# Patient Record
Sex: Female | Born: 1945 | Race: Black or African American | Hispanic: No | Smoking: Never smoker
Health system: Southern US, Community
[De-identification: ages and names within clinical notes are randomized; demographics above are authoritative.]

## PROBLEM LIST (undated history)

## (undated) DIAGNOSIS — I10 Essential (primary) hypertension: Secondary | ICD-10-CM

## (undated) DIAGNOSIS — Z9889 Other specified postprocedural states: Secondary | ICD-10-CM

## (undated) DIAGNOSIS — E669 Obesity, unspecified: Secondary | ICD-10-CM

## (undated) DIAGNOSIS — M719 Bursopathy, unspecified: Secondary | ICD-10-CM

## (undated) DIAGNOSIS — R51 Headache: Secondary | ICD-10-CM

## (undated) DIAGNOSIS — D219 Benign neoplasm of connective and other soft tissue, unspecified: Secondary | ICD-10-CM

## (undated) DIAGNOSIS — Z8742 Personal history of other diseases of the female genital tract: Secondary | ICD-10-CM

## (undated) DIAGNOSIS — Z789 Other specified health status: Secondary | ICD-10-CM

## (undated) DIAGNOSIS — R519 Headache, unspecified: Secondary | ICD-10-CM

## (undated) DIAGNOSIS — R1900 Intra-abdominal and pelvic swelling, mass and lump, unspecified site: Secondary | ICD-10-CM

## (undated) DIAGNOSIS — J309 Allergic rhinitis, unspecified: Secondary | ICD-10-CM

## (undated) DIAGNOSIS — R9431 Abnormal electrocardiogram [ECG] [EKG]: Secondary | ICD-10-CM

## (undated) HISTORY — DX: Intra-abdominal and pelvic swelling, mass and lump, unspecified site: R19.00

## (undated) HISTORY — DX: Benign neoplasm of connective and other soft tissue, unspecified: D21.9

## (undated) HISTORY — DX: Allergic rhinitis, unspecified: J30.9

## (undated) HISTORY — DX: Obesity, unspecified: E66.9

## (undated) HISTORY — PX: COLONOSCOPY: SHX174

## (undated) HISTORY — DX: Headache: R51

## (undated) HISTORY — DX: Headache, unspecified: R51.9

## (undated) HISTORY — DX: Personal history of other diseases of the female genital tract: Z98.890

## (undated) HISTORY — DX: Abnormal electrocardiogram (ECG) (EKG): R94.31

## (undated) HISTORY — DX: Essential (primary) hypertension: I10

## (undated) HISTORY — DX: Personal history of other diseases of the female genital tract: Z87.42

---

## 1988-05-22 HISTORY — PX: ABDOMINAL HYSTERECTOMY: SHX81

## 1997-11-25 ENCOUNTER — Other Ambulatory Visit: Admission: RE | Admit: 1997-11-25 | Discharge: 1997-11-25 | Payer: Self-pay | Admitting: Internal Medicine

## 1998-01-19 ENCOUNTER — Ambulatory Visit (HOSPITAL_COMMUNITY): Admission: RE | Admit: 1998-01-19 | Discharge: 1998-01-19 | Payer: Self-pay | Admitting: Internal Medicine

## 1998-01-21 ENCOUNTER — Ambulatory Visit (HOSPITAL_COMMUNITY): Admission: RE | Admit: 1998-01-21 | Discharge: 1998-01-21 | Payer: Self-pay | Admitting: Internal Medicine

## 1998-03-09 ENCOUNTER — Ambulatory Visit (HOSPITAL_COMMUNITY): Admission: RE | Admit: 1998-03-09 | Discharge: 1998-03-09 | Payer: Self-pay | Admitting: *Deleted

## 1998-05-21 ENCOUNTER — Ambulatory Visit (HOSPITAL_COMMUNITY): Admission: RE | Admit: 1998-05-21 | Discharge: 1998-05-21 | Payer: Self-pay | Admitting: Internal Medicine

## 1998-05-21 ENCOUNTER — Encounter: Payer: Self-pay | Admitting: Internal Medicine

## 1998-12-16 ENCOUNTER — Encounter: Payer: Self-pay | Admitting: Internal Medicine

## 1998-12-16 ENCOUNTER — Emergency Department (HOSPITAL_COMMUNITY): Admission: EM | Admit: 1998-12-16 | Discharge: 1998-12-16 | Payer: Self-pay | Admitting: Internal Medicine

## 2000-06-19 ENCOUNTER — Encounter: Admission: RE | Admit: 2000-06-19 | Discharge: 2000-06-19 | Payer: Self-pay | Admitting: Internal Medicine

## 2000-06-19 ENCOUNTER — Encounter: Payer: Self-pay | Admitting: Internal Medicine

## 2000-09-26 ENCOUNTER — Encounter: Payer: Self-pay | Admitting: Internal Medicine

## 2000-09-26 ENCOUNTER — Encounter: Admission: RE | Admit: 2000-09-26 | Discharge: 2000-09-26 | Payer: Self-pay | Admitting: Internal Medicine

## 2001-03-04 ENCOUNTER — Other Ambulatory Visit: Admission: RE | Admit: 2001-03-04 | Discharge: 2001-03-04 | Payer: Self-pay | Admitting: Internal Medicine

## 2001-04-23 ENCOUNTER — Emergency Department (HOSPITAL_COMMUNITY): Admission: EM | Admit: 2001-04-23 | Discharge: 2001-04-23 | Payer: Self-pay

## 2004-03-25 ENCOUNTER — Other Ambulatory Visit: Admission: RE | Admit: 2004-03-25 | Discharge: 2004-03-25 | Payer: Self-pay | Admitting: Internal Medicine

## 2004-04-13 ENCOUNTER — Encounter: Admission: RE | Admit: 2004-04-13 | Discharge: 2004-04-13 | Payer: Self-pay | Admitting: Internal Medicine

## 2005-05-22 HISTORY — PX: INNER EAR SURGERY: SHX679

## 2005-06-09 ENCOUNTER — Encounter: Admission: RE | Admit: 2005-06-09 | Discharge: 2005-06-09 | Payer: Self-pay | Admitting: Internal Medicine

## 2005-06-28 ENCOUNTER — Encounter: Admission: RE | Admit: 2005-06-28 | Discharge: 2005-06-28 | Payer: Self-pay | Admitting: Internal Medicine

## 2005-07-13 ENCOUNTER — Other Ambulatory Visit: Admission: RE | Admit: 2005-07-13 | Discharge: 2005-07-13 | Payer: Self-pay | Admitting: Internal Medicine

## 2006-01-04 ENCOUNTER — Encounter: Admission: RE | Admit: 2006-01-04 | Discharge: 2006-01-04 | Payer: Self-pay | Admitting: Internal Medicine

## 2006-02-26 ENCOUNTER — Encounter: Admission: RE | Admit: 2006-02-26 | Discharge: 2006-02-26 | Payer: Self-pay | Admitting: Internal Medicine

## 2006-07-02 ENCOUNTER — Encounter: Admission: RE | Admit: 2006-07-02 | Discharge: 2006-07-02 | Payer: Self-pay | Admitting: Internal Medicine

## 2007-07-17 ENCOUNTER — Encounter: Admission: RE | Admit: 2007-07-17 | Discharge: 2007-07-17 | Payer: Self-pay | Admitting: Internal Medicine

## 2008-06-16 ENCOUNTER — Encounter: Admission: RE | Admit: 2008-06-16 | Discharge: 2008-06-16 | Payer: Self-pay | Admitting: Internal Medicine

## 2008-07-23 ENCOUNTER — Encounter: Admission: RE | Admit: 2008-07-23 | Discharge: 2008-07-23 | Payer: Self-pay | Admitting: Obstetrics and Gynecology

## 2009-06-03 ENCOUNTER — Encounter: Admission: RE | Admit: 2009-06-03 | Discharge: 2009-06-03 | Payer: Self-pay | Admitting: Internal Medicine

## 2009-07-30 ENCOUNTER — Encounter: Admission: RE | Admit: 2009-07-30 | Discharge: 2009-07-30 | Payer: Self-pay | Admitting: Obstetrics and Gynecology

## 2010-07-08 ENCOUNTER — Other Ambulatory Visit: Payer: Self-pay | Admitting: Obstetrics and Gynecology

## 2010-07-08 DIAGNOSIS — Z1231 Encounter for screening mammogram for malignant neoplasm of breast: Secondary | ICD-10-CM

## 2010-08-02 ENCOUNTER — Ambulatory Visit
Admission: RE | Admit: 2010-08-02 | Discharge: 2010-08-02 | Disposition: A | Discharge status: Home / Self Care | Payer: BC Managed Care – PPO | Source: Ambulatory Visit | Attending: Obstetrics and Gynecology | Admitting: Obstetrics and Gynecology

## 2010-08-02 ENCOUNTER — Ambulatory Visit: Payer: Self-pay

## 2010-08-02 DIAGNOSIS — Z1231 Encounter for screening mammogram for malignant neoplasm of breast: Secondary | ICD-10-CM

## 2010-11-04 ENCOUNTER — Encounter (INDEPENDENT_AMBULATORY_CARE_PROVIDER_SITE_OTHER): Payer: BC Managed Care – PPO

## 2010-11-04 DIAGNOSIS — M79609 Pain in unspecified limb: Secondary | ICD-10-CM

## 2010-11-21 NOTE — Procedures (Unsigned)
DUPLEX DEEP VENOUS EXAM - LOWER EXTREMITY  INDICATION:  Left lower extremity pain  HISTORY:  Edema:  No Trauma/Surgery:  No Pain:  Yes PE:  No Previous DVT:  No Anticoagulants:  No Other:  DUPLEX EXAM:               CFV   SFV   PopV  PTV    GSV               R  L  R  L  R  L  R   L  R  L Thrombosis    o  o     o     o      o     o Spontaneous   +  +     +     +      +     + Phasic        +  +     +     +      +     + Augmentation  +  +     +     +      +     + Compressible  +  +     +     +      +     + Competent     +  +     +     +      +     +  Legend:  + - yes  o - no  p - partial  D - decreased  IMPRESSION: 1. No evidence of left lower extremity deep venous thrombosis. 2. Preliminary results were called to Dr. August Saucer.   _____________________________ Larina Earthly, M.D.  EM/MEDQ  D:  11/07/2010  T:  11/07/2010  Job:  [id number]

## 2011-07-17 DIAGNOSIS — H61303 Acquired stenosis of external ear canal, unspecified, bilateral: Secondary | ICD-10-CM | POA: Insufficient documentation

## 2011-08-04 ENCOUNTER — Other Ambulatory Visit: Payer: Self-pay | Admitting: Obstetrics and Gynecology

## 2011-08-04 DIAGNOSIS — Z1231 Encounter for screening mammogram for malignant neoplasm of breast: Secondary | ICD-10-CM

## 2011-08-11 ENCOUNTER — Ambulatory Visit: Payer: BC Managed Care – PPO

## 2011-08-14 ENCOUNTER — Ambulatory Visit
Admission: RE | Admit: 2011-08-14 | Discharge: 2011-08-14 | Disposition: A | Discharge status: Home / Self Care | Payer: BC Managed Care – PPO | Source: Ambulatory Visit | Attending: Obstetrics and Gynecology | Admitting: Obstetrics and Gynecology

## 2011-08-14 DIAGNOSIS — Z1231 Encounter for screening mammogram for malignant neoplasm of breast: Secondary | ICD-10-CM

## 2012-06-14 ENCOUNTER — Encounter (HOSPITAL_COMMUNITY): Payer: Self-pay

## 2012-06-14 ENCOUNTER — Ambulatory Visit: Payer: BC Managed Care – PPO | Attending: Gynecology | Admitting: Gynecology

## 2012-06-14 ENCOUNTER — Ambulatory Visit (HOSPITAL_COMMUNITY)
Admission: RE | Admit: 2012-06-14 | Payer: BC Managed Care – PPO | Source: Ambulatory Visit | Admitting: Obstetrics and Gynecology

## 2012-06-14 ENCOUNTER — Encounter (HOSPITAL_COMMUNITY): Admission: RE | Payer: Self-pay | Source: Ambulatory Visit

## 2012-06-14 ENCOUNTER — Encounter: Payer: Self-pay | Admitting: Gynecology

## 2012-06-14 ENCOUNTER — Ambulatory Visit (HOSPITAL_COMMUNITY)
Admission: RE | Admit: 2012-06-14 | Discharge: 2012-06-14 | Disposition: A | Discharge status: Home / Self Care | Payer: BC Managed Care – PPO | Source: Ambulatory Visit | Attending: Gynecology | Admitting: Gynecology

## 2012-06-14 VITALS — BP 148/76 | HR 80 | Temp 98.2°F | Resp 20 | Ht 62.0 in | Wt 177.0 lb

## 2012-06-14 DIAGNOSIS — K7689 Other specified diseases of liver: Secondary | ICD-10-CM | POA: Insufficient documentation

## 2012-06-14 DIAGNOSIS — Z79899 Other long term (current) drug therapy: Secondary | ICD-10-CM | POA: Insufficient documentation

## 2012-06-14 DIAGNOSIS — R1900 Intra-abdominal and pelvic swelling, mass and lump, unspecified site: Secondary | ICD-10-CM

## 2012-06-14 DIAGNOSIS — Z90710 Acquired absence of both cervix and uterus: Secondary | ICD-10-CM | POA: Insufficient documentation

## 2012-06-14 DIAGNOSIS — R978 Other abnormal tumor markers: Secondary | ICD-10-CM | POA: Insufficient documentation

## 2012-06-14 DIAGNOSIS — R918 Other nonspecific abnormal finding of lung field: Secondary | ICD-10-CM | POA: Insufficient documentation

## 2012-06-14 DIAGNOSIS — N9489 Other specified conditions associated with female genital organs and menstrual cycle: Secondary | ICD-10-CM | POA: Insufficient documentation

## 2012-06-14 DIAGNOSIS — R772 Abnormality of alphafetoprotein: Secondary | ICD-10-CM | POA: Insufficient documentation

## 2012-06-14 DIAGNOSIS — N83209 Unspecified ovarian cyst, unspecified side: Secondary | ICD-10-CM

## 2012-06-14 SURGERY — LAPAROSCOPY OPERATIVE
Anesthesia: General

## 2012-06-14 MED ORDER — IOHEXOL 300 MG/ML  SOLN: 100.0000 mL | Freq: Once | INTRAMUSCULAR | Status: AC | PRN

## 2012-06-14 NOTE — Progress Notes (Signed)
Consult Note: Gyn-Onc   Elizabeth Chan 67 y.o. female  Chief Complaint  Patient presents with  . Pelvic Mass    New patient     HPI: 67 year old African American female seen in consultation at the request of Dr. Rudean Haskell regarding management of an ovarian mass and an elevated alpha-fetoprotein.  The patient was found to have an ovarian mass on routine gynecologic examination. Subsequent imaging with a transvaginal ultrasound revealed a 6.4 x 4.1 x 7.2 cm mass which was cystic with a thick wall and some septations. The mass was avascular. No ascites was noted. Tumor markers including CEA CA 125 and alpha-fetoprotein were obtained. The alpha-fetoprotein value returned slightly elevated at 11.4 (normal range 0-8.0 ng/mL.  Patient has a past history of undergoing a hysterectomy for symptomatic uterine fibroids. She has no other gynecologic history.  Review of Systems:10 point review of systems is negative as noted above.   Vitals: Blood pressure 148/76, pulse 80, temperature 98.2 F (36.8 C), resp. rate 20, height 5\' 2"  (1.575 m), weight 177 lb (80.287 kg).  Physical Exam: General : The patient is a healthy woman in no acute distress.  HEENT: normocephalic, extraoccular movements normal; neck is supple without thyromegally  Lynphnodes: Supraclavicular and inguinal nodes not enlarged  Abdomen: Soft, non-tender, no ascites, no organomegally, no masses, no hernias  Pelvic:  EGBUS: Normal female  Vagina: Normal, no lesions  Urethra and Bladder: Normal, non-tender  Cervix: Surgically absent  Uterus: Surgically absent  Bi-manual examination: There is a smooth 6 cm mass to the right of the vaginal cuff. It is not tender and is slightly mobile. Rectal: normal sphincter tone, no masses, no blood  Lower extremities: No edema or varicosities. Normal range of motion    Assessment/Plan: Ovarian cyst with a thick wall but appearing benign. Further the usual tumor markers for epithelial  ovarian cancers are normal.  The elevated alpha-fetoprotein is of some concern in that we do not have a good explanation for the elevation. I believe that is unlikely that her ovarian cyst is a germ cell tumor. Therefore I would recommend we search elsewhere for other sources of an elevated alpha-fetoprotein. (The elevated alpha-fetoprotein likely secondary to hepatitis, colitis, hepatocellular carcinoma, other germ cell tumors, lymphoma, renal cell cancer, or pancreatic cancer. )  We will schedule the patient had a CT scan of the chest abdomen and pelvis prior to the salpingo-oophorectomy.  The patient's questions are answered.  No Known Allergies  Past Medical History  Diagnosis Date  . Fibroids   . Pelvic mass in female     Past Surgical History  Procedure Date  . Abdominal hysterectomy 1990    for fibroids  . Inner ear surgery 2007    Current Outpatient Prescriptions  Medication Sig Dispense Refill  . Calcium Carbonate-Vitamin D (CALCIUM 600 + D PO) Take 2 tablets by mouth daily.      Marland Kitchen levocetirizine (XYZAL) 5 MG tablet Take 5 mg by mouth every evening.      . montelukast (SINGULAIR) 10 MG tablet Take 10 mg by mouth at bedtime.      . multivitamin-iron-minerals-folic acid (CENTRUM) chewable tablet Chew 1 tablet by mouth daily.      . Nutritional Supplements (MENOPAUSE FORMULA PO) Take 1 tablet by mouth at bedtime.      . triamterene-hydrochlorothiazide (MAXZIDE-25) 37.5-25 MG per tablet Take 1 tablet by mouth daily.        History   Social History  . Marital Status: Widowed  Spouse Name: N/A    Number of Children: N/A  . Years of Education: N/A   Occupational History  . Not on file.   Social History Main Topics  . Smoking status: Never Smoker   . Smokeless tobacco: Not on file  . Alcohol Use: No  . Drug Use: No  . Sexually Active:    Other Topics Concern  . Not on file   Social History Narrative  . No narrative on file    Family History  Problem  Relation Age of Onset  . Hypertension Mother   . Diabetes Sister   . Breast cancer Maternal Aunt       Jeannette Corpus, MD 06/14/2012, 10:11 AM

## 2012-06-14 NOTE — Patient Instructions (Signed)
We will obtain a CT scan of the chest abdomen and pelvis and contact you with the results.

## 2012-06-18 ENCOUNTER — Encounter (HOSPITAL_COMMUNITY): Payer: Self-pay | Admitting: Pharmacist

## 2012-06-20 ENCOUNTER — Encounter (HOSPITAL_COMMUNITY): Payer: Self-pay

## 2012-06-20 ENCOUNTER — Encounter (HOSPITAL_COMMUNITY)
Admission: RE | Admit: 2012-06-20 | Discharge: 2012-06-20 | Disposition: A | Discharge status: Home / Self Care | Payer: BC Managed Care – PPO | Source: Ambulatory Visit | Attending: Obstetrics and Gynecology | Admitting: Obstetrics and Gynecology

## 2012-06-20 DIAGNOSIS — Z01818 Encounter for other preprocedural examination: Secondary | ICD-10-CM | POA: Insufficient documentation

## 2012-06-20 DIAGNOSIS — Z01812 Encounter for preprocedural laboratory examination: Secondary | ICD-10-CM | POA: Insufficient documentation

## 2012-06-20 HISTORY — DX: Other specified health status: Z78.9

## 2012-06-20 LAB — BASIC METABOLIC PANEL
BUN: 13 mg/dL (ref 6–23)
CO2: 26 mEq/L (ref 19–32)
Calcium: 10.1 mg/dL (ref 8.4–10.5)
Chloride: 104 mEq/L (ref 96–112)
Creatinine, Ser: 0.77 mg/dL (ref 0.50–1.10)
GFR calc Af Amer: >90 mL/min (ref >90)
GFR calc non Af Amer: 87 mL/min — ABNORMAL LOW (ref >90)
Glucose, Bld: 83 mg/dL (ref 70–99)
Potassium: 3.7 mEq/L (ref 3.5–5.1)
Sodium: 141 mEq/L (ref 135–145)

## 2012-06-20 LAB — CBC
HCT: 38.8 % (ref 36.0–46.0)
Hemoglobin: 13.2 g/dL (ref 12.0–15.0)
MCH: 30.7 pg (ref 26.0–34.0)
MCHC: 34 g/dL (ref 30.0–36.0)
MCV: 90.2 fL (ref 78.0–100.0)
Platelets: 268 10*3/uL (ref 150–400)
RBC: 4.3 MIL/uL (ref 3.87–5.11)
RDW: 13.9 % (ref 11.5–15.5)
WBC: 4.1 10*3/uL (ref 4.0–10.5)

## 2012-06-20 LAB — SURGICAL PCR SCREEN
MRSA, PCR: NEGATIVE
Staphylococcus aureus: NEGATIVE

## 2012-06-20 NOTE — Pre-Procedure Instructions (Signed)
EKG reviewed by Dr Malen Gauze. He requested cardiac clearance prior to surg. Appt made by pt during PAT appt to see Dr Willey Blade this afternoon. Call made to Dr. Paralee Cancel office Selena Batten) to make her aware. Previous EKG requested from Dr Diamantina Providence office if available.

## 2012-06-20 NOTE — Patient Instructions (Addendum)
20 Elizabeth Chan  06/20/2012   Your procedure is scheduled on:  06/24/12  Enter through the Main Entrance of [facility] at 6 AM.  Pick up the phone at the desk and dial [phone].   Call this number if you have problems the morning of surgery: [phone]   Remember:   Do not eat food:After Midnight.  Do not drink clear liquids: After Midnight.  Take these medicines the morning of surgery with A SIP OF WATER: NA   Do not wear jewelry, make-up or nail polish.  Do not wear lotions, powders, or perfumes. You may wear deodorant.  Do not shave 48 hours prior to surgery.  Do not bring valuables to the hospital.  Contacts, dentures or bridgework may not be worn into surgery.  Leave suitcase in the car. After surgery it may be brought to your room.  For patients admitted to the hospital, checkout time is 11:00 AM the day of discharge.   Patients discharged the day of surgery will not be allowed to drive home.  Name and phone number of your driver: NA  Special Instructions: Shower using CHG 2 nights before surgery and the night before surgery.  If you shower the day of surgery use CHG.  Use special wash - you have one bottle of CHG for all showers.  You should use approximately 1/3 of the bottle for each shower.   Please read over the following fact sheets that you were given: MRSA Information

## 2012-06-24 ENCOUNTER — Ambulatory Visit (HOSPITAL_COMMUNITY)
Admission: RE | Admit: 2012-06-24 | Payer: BC Managed Care – PPO | Source: Ambulatory Visit | Admitting: Obstetrics and Gynecology

## 2012-06-24 ENCOUNTER — Encounter (HOSPITAL_COMMUNITY): Admission: RE | Payer: Self-pay | Source: Ambulatory Visit

## 2012-06-24 SURGERY — LAPAROSCOPY OPERATIVE
Anesthesia: General

## 2012-06-28 ENCOUNTER — Encounter (HOSPITAL_COMMUNITY): Payer: Self-pay

## 2012-06-28 NOTE — Progress Notes (Signed)
Spoke to [place] at Dr Alfonso Patten office concerning clearance for surgery on 2/17.  Pt had pat on 1/30 but needed evaluation and clearance from Dr August Saucer  Office states clearance given -will fax EKG and clearance to [place].

## 2012-07-07 ENCOUNTER — Other Ambulatory Visit: Payer: Self-pay | Admitting: Obstetrics and Gynecology

## 2012-07-07 NOTE — H&P (Signed)
Elizabeth Chan is an 67 y.o. female. [id number] s/p Hysterectomy for fibroids.  In December 2013 came in for a routine exam, complained of backache and pelvic pressure on exam. An ultrasound revealed a 64 x 41 x 72 mm right ovarian cyst, possibly a cystadenoma. An ovarian tumor marker profile was within normal limits except the AFP was slightly elevated.  A gyn Oncology consult was obtained by Dr De Blanch. A CT scan was ordered the had the appearance of a benign cyst and other abdominal and pelvic findings were normal.  The patient was given the option of having her surgery in [facility] with Dr Stanford Breed and opted to stay here.  Risks, including the possibility of an occult malignancy have been discussed.  Pt is aware of possiblity of laparotomy and addition procedures if indicated.  Informed consent was given for LS BSO and possible laparotomy.   Pertinent Gynecological History: Menses: post-menopausal and and s/p hysterectomy Bleeding: none Contraception: post menopausal status DES exposure: denies Blood transfusions: none Sexually transmitted diseases: no past history Previous GYN Procedures: hysterectomy fibroids 1990  Last mammogram: normal Date: 04/2012 Last pap: normal Date: 2010    Menstrual History: No LMP recorded. Patient has had a hysterectomy.    Past Medical History  Diagnosis Date  . Fibroids   . Pelvic mass in female   . No pertinent past medical history     Past Surgical History  Procedure Laterality Date  . Abdominal hysterectomy  1990    for fibroids  . Inner ear surgery  2007    Family History  Problem Relation Age of Onset  . Hypertension Mother   . Diabetes Sister   . Breast cancer Maternal Aunt     Social History:  reports that she has never smoked. She has never used smokeless tobacco. She reports that she does not drink alcohol or use illicit drugs.  Allergies:  Allergies  Allergen Reactions  . Doxycycline Itching   ROS non  contributory  PHYSICALEXAM  General Well developed, no distress HEENT nl Chest clear S1 S2 clear Abd BS present, no palpable masses Exr nl Pelvic uterus absent Posterior cul de sac, 4-5 cm palpable ? Cyst Skin:  Hx of lichen plalnus   No results found for this or any previous visit (from the past 24 hour(s)).  No results found.  Assessment/Plan:66 yo with a large ovarion cyst noted.  Right ovarian cyst Slightly elevated AFP P:  LS BSO with removal of right ovarian cyst  Elizabeth Chan E 07/07/2012, 10:16 PM

## 2012-07-07 NOTE — H&P (Signed)
Elizabeth Chan is an 66 y.o. female G3 P2 A1 s/p hysterectomy in 1990 for fibroids who came in for a routine gyn exam in December and complained of backache and pelvic pressure on exam.  Ultrasound revealed a 64 x 41 x 72 mm mass, benign appearing.  The ovarian tumor marker profile was normal except the AFP was slightly elevated. Dr Clarke Pearson, Gyn Oncologist was consulted.  CT scan was consistent with a benign cyst and abdominal and pelvic organs otherwise appeared normal.  Elizabeth Chan was given the option of having surgery in [place] with Dr Clarke Pearson and opted to have it here.  Risks, possible complications have been discussed including the possibility of occult malignancy requiring additional procedures at a later date have been discussed and informed consent was given for LS BSO and possible laparotomy.    Pertinent Gynecological History: Menses: post-menopausal and hyst Bleeding: no Contraception: status post hysterectomy DES exposure: denies Blood transfusions: none Sexually transmitted diseases: no past history Previous GYN Procedures: hyst 1990 fibroids  Last mammogram: normal Date: 2013 Last pap: normal Date: 2010    Menstrual History: Menarche age: na No LMP recorded. Patient has had a hysterectomy.    Past Medical History  Diagnosis Date  . Fibroids   . Pelvic mass in female   . No pertinent past medical history     Past Surgical History  Procedure Laterality Date  . Abdominal hysterectomy  1990    for fibroids  . Inner ear surgery  2007    Family History  Problem Relation Age of Onset  . Hypertension Mother   . Diabetes Sister   . Breast cancer Maternal Aunt     Social History:  reports that she has never smoked. She has never used smokeless tobacco. She reports that she does not drink alcohol or use illicit drugs.  Allergies:  Allergies  Allergen Reactions  . Doxycycline Itching     (Not in a hospital admission)  ROS Non  contributory  Physical exam  General  No distress, well developed HEENT nl Chest clear Heart S1 and S2 clear Abd BS present, no masses Ext nl Pelvic fullness near cul de sac, slightly tender Skin  Lichen planus      No results found.  Assessment/Plan: Right ovarian cyst, slightly elevated AFP P: LS BSO, possible laparotomy   GREENE,ELEANOR E 07/07/2012, 10:51 PM  

## 2012-07-08 ENCOUNTER — Encounter (HOSPITAL_COMMUNITY): Payer: Self-pay | Admitting: Anesthesiology

## 2012-07-08 ENCOUNTER — Encounter (HOSPITAL_COMMUNITY)
Admission: RE | Disposition: A | Discharge status: Home / Self Care | Payer: Self-pay | Source: Ambulatory Visit | Attending: Obstetrics and Gynecology

## 2012-07-08 ENCOUNTER — Ambulatory Visit (HOSPITAL_COMMUNITY)
Admission: RE | Admit: 2012-07-08 | Discharge: 2012-07-08 | Disposition: A | Discharge status: Home / Self Care | Payer: BC Managed Care – PPO | Source: Ambulatory Visit | Attending: Obstetrics and Gynecology | Admitting: Obstetrics and Gynecology

## 2012-07-08 ENCOUNTER — Inpatient Hospital Stay (HOSPITAL_COMMUNITY): Payer: BC Managed Care – PPO | Admitting: Anesthesiology

## 2012-07-08 DIAGNOSIS — D279 Benign neoplasm of unspecified ovary: Secondary | ICD-10-CM | POA: Insufficient documentation

## 2012-07-08 DIAGNOSIS — N83209 Unspecified ovarian cyst, unspecified side: Secondary | ICD-10-CM

## 2012-07-08 DIAGNOSIS — N9489 Other specified conditions associated with female genital organs and menstrual cycle: Secondary | ICD-10-CM | POA: Insufficient documentation

## 2012-07-08 DIAGNOSIS — Z01812 Encounter for preprocedural laboratory examination: Secondary | ICD-10-CM | POA: Insufficient documentation

## 2012-07-08 DIAGNOSIS — Z01818 Encounter for other preprocedural examination: Secondary | ICD-10-CM | POA: Insufficient documentation

## 2012-07-08 HISTORY — PX: LAPAROSCOPY: SHX197

## 2012-07-08 HISTORY — PX: SALPINGOOPHORECTOMY: SHX82

## 2012-07-08 LAB — BASIC METABOLIC PANEL
BUN: 11 mg/dL (ref 6–23)
CO2: 25 mEq/L (ref 19–32)
Calcium: 9.7 mg/dL (ref 8.4–10.5)
Chloride: 96 mEq/L (ref 96–112)
Creatinine, Ser: 0.94 mg/dL (ref 0.50–1.10)
GFR calc Af Amer: 73 mL/min — ABNORMAL LOW (ref >90)
GFR calc non Af Amer: 63 mL/min — ABNORMAL LOW (ref >90)
Glucose, Bld: 97 mg/dL (ref 70–99)
Potassium: 3.5 mEq/L (ref 3.5–5.1)
Sodium: 133 mEq/L — ABNORMAL LOW (ref 135–145)

## 2012-07-08 LAB — SAMPLE TO BLOOD BANK

## 2012-07-08 SURGERY — LAPAROSCOPY OPERATIVE
Anesthesia: General | Site: Abdomen | Wound class: Clean

## 2012-07-08 MED ORDER — LIDOCAINE HCL (CARDIAC) 20 MG/ML IV SOLN
INTRAVENOUS | Status: AC
  Filled 2012-07-08: qty 5

## 2012-07-08 MED ORDER — NEOSTIGMINE METHYLSULFATE 1 MG/ML IJ SOLN
INTRAMUSCULAR | Status: AC
  Filled 2012-07-08: qty 1

## 2012-07-08 MED ORDER — GLYCOPYRROLATE 0.2 MG/ML IJ SOLN
INTRAMUSCULAR | Status: DC | PRN
  Administered 2012-07-08: 0.4 mg via INTRAVENOUS

## 2012-07-08 MED ORDER — FENTANYL CITRATE 0.05 MG/ML IJ SOLN
INTRAMUSCULAR | Status: AC
  Filled 2012-07-08: qty 5

## 2012-07-08 MED ORDER — PROPOFOL 10 MG/ML IV EMUL
INTRAVENOUS | Status: DC | PRN
  Administered 2012-07-08: 160 mg via INTRAVENOUS

## 2012-07-08 MED ORDER — PROMETHAZINE HCL 25 MG/ML IJ SOLN: 6.2500 mg | INTRAMUSCULAR | Status: DC | PRN

## 2012-07-08 MED ORDER — FENTANYL CITRATE 0.05 MG/ML IJ SOLN: 25.0000 ug | INTRAMUSCULAR | Status: DC | PRN

## 2012-07-08 MED ORDER — EPHEDRINE 5 MG/ML INJ
INTRAVENOUS | Status: AC
  Filled 2012-07-08: qty 10

## 2012-07-08 MED ORDER — MIDAZOLAM HCL 2 MG/2ML IJ SOLN: 0.5000 mg | Freq: Once | INTRAMUSCULAR | Status: DC | PRN

## 2012-07-08 MED ORDER — KETOROLAC TROMETHAMINE 30 MG/ML IJ SOLN
INTRAMUSCULAR | Status: AC
  Filled 2012-07-08: qty 1

## 2012-07-08 MED ORDER — LACTATED RINGERS IR SOLN
Status: DC | PRN
  Administered 2012-07-08: 3000 mL

## 2012-07-08 MED ORDER — KETOROLAC TROMETHAMINE 30 MG/ML IJ SOLN: 15.0000 mg | Freq: Once | INTRAMUSCULAR | Status: DC | PRN

## 2012-07-08 MED ORDER — NEOSTIGMINE METHYLSULFATE 1 MG/ML IJ SOLN
INTRAMUSCULAR | Status: DC | PRN
  Administered 2012-07-08: 3 mg via INTRAVENOUS

## 2012-07-08 MED ORDER — ROCURONIUM BROMIDE 50 MG/5ML IV SOLN
INTRAVENOUS | Status: AC
  Filled 2012-07-08: qty 1

## 2012-07-08 MED ORDER — BUPIVACAINE-EPINEPHRINE 0.25% -1:200000 IJ SOLN
INTRAMUSCULAR | Status: DC | PRN
  Administered 2012-07-08: 10 mL

## 2012-07-08 MED ORDER — PROPOFOL 10 MG/ML IV EMUL
INTRAVENOUS | Status: AC
  Filled 2012-07-08: qty 20

## 2012-07-08 MED ORDER — MIDAZOLAM HCL 2 MG/2ML IJ SOLN
INTRAMUSCULAR | Status: AC
  Filled 2012-07-08: qty 2

## 2012-07-08 MED ORDER — MIDAZOLAM HCL 5 MG/5ML IJ SOLN
INTRAMUSCULAR | Status: DC | PRN
  Administered 2012-07-08: 2 mg via INTRAVENOUS

## 2012-07-08 MED ORDER — ONDANSETRON HCL 4 MG/2ML IJ SOLN
INTRAMUSCULAR | Status: DC | PRN
  Administered 2012-07-08: 4 mg via INTRAVENOUS

## 2012-07-08 MED ORDER — ONDANSETRON HCL 4 MG/2ML IJ SOLN
INTRAMUSCULAR | Status: AC
  Filled 2012-07-08: qty 2

## 2012-07-08 MED ORDER — GLYCOPYRROLATE 0.2 MG/ML IJ SOLN
INTRAMUSCULAR | Status: AC
  Filled 2012-07-08: qty 3

## 2012-07-08 MED ORDER — DEXTROSE IN LACTATED RINGERS 5 % IV SOLN: INTRAVENOUS | Status: DC

## 2012-07-08 MED ORDER — CEFAZOLIN SODIUM-DEXTROSE 2-3 GM-% IV SOLR
INTRAVENOUS | Status: AC
  Administered 2012-07-08: 2 g via INTRAVENOUS
  Filled 2012-07-08: qty 50

## 2012-07-08 MED ORDER — KETOROLAC TROMETHAMINE 30 MG/ML IJ SOLN
INTRAMUSCULAR | Status: DC | PRN
  Administered 2012-07-08: 30 mg via INTRAVENOUS

## 2012-07-08 MED ORDER — EPHEDRINE SULFATE 50 MG/ML IJ SOLN
INTRAMUSCULAR | Status: DC | PRN
  Administered 2012-07-08 (×2): 10 mg via INTRAVENOUS

## 2012-07-08 MED ORDER — DEXAMETHASONE SODIUM PHOSPHATE 10 MG/ML IJ SOLN
INTRAMUSCULAR | Status: DC | PRN
  Administered 2012-07-08: 10 mg via INTRAVENOUS

## 2012-07-08 MED ORDER — CEFAZOLIN SODIUM-DEXTROSE 2-3 GM-% IV SOLR: 2.0000 g | INTRAVENOUS | Status: DC

## 2012-07-08 MED ORDER — DEXAMETHASONE SODIUM PHOSPHATE 10 MG/ML IJ SOLN
INTRAMUSCULAR | Status: AC
  Filled 2012-07-08: qty 1

## 2012-07-08 MED ORDER — FENTANYL CITRATE 0.05 MG/ML IJ SOLN
INTRAMUSCULAR | Status: DC | PRN
  Administered 2012-07-08 (×2): 50 ug via INTRAVENOUS
  Administered 2012-07-08: 100 ug via INTRAVENOUS
  Administered 2012-07-08: 50 ug via INTRAVENOUS

## 2012-07-08 MED ORDER — LIDOCAINE HCL (CARDIAC) 20 MG/ML IV SOLN
INTRAVENOUS | Status: DC | PRN
  Administered 2012-07-08: 50 mg via INTRAVENOUS

## 2012-07-08 MED ORDER — BUPIVACAINE HCL (PF) 0.25 % IJ SOLN
INTRAMUSCULAR | Status: AC
  Filled 2012-07-08: qty 30

## 2012-07-08 MED ORDER — LACTATED RINGERS IV SOLN
INTRAVENOUS | Status: DC
  Administered 2012-07-08: 11:00:00 via INTRAVENOUS
  Administered 2012-07-08: 50 mL/h via INTRAVENOUS

## 2012-07-08 MED ORDER — ROCURONIUM BROMIDE 100 MG/10ML IV SOLN
INTRAVENOUS | Status: DC | PRN
  Administered 2012-07-08: 10 mg via INTRAVENOUS
  Administered 2012-07-08: 40 mg via INTRAVENOUS

## 2012-07-08 MED ORDER — MEPERIDINE HCL 25 MG/ML IJ SOLN: 6.2500 mg | INTRAMUSCULAR | Status: DC | PRN

## 2012-07-08 SURGICAL SUPPLY — 44 items
APL SKNCLS STERI-STRIP NONHPOA (GAUZE/BANDAGES/DRESSINGS) ×3
BAG SPEC RTRVL LRG 6X4 10 (ENDOMECHANICALS) ×3
BENZOIN TINCTURE PRP APPL 2/3 (GAUZE/BANDAGES/DRESSINGS) ×2 IMPLANT
CABLE HIGH FREQUENCY MONO STRZ (ELECTRODE) IMPLANT
CANISTER SUCTION 2500CC (MISCELLANEOUS) ×4 IMPLANT
CHLORAPREP W/TINT 26ML (MISCELLANEOUS) ×4 IMPLANT
CLOTH BEACON ORANGE TIMEOUT ST (SAFETY) ×4 IMPLANT
CONT PATH 16OZ SNAP LID 3702 (MISCELLANEOUS) ×4 IMPLANT
DISSECTOR SPONGE CHERRY (GAUZE/BANDAGES/DRESSINGS) IMPLANT
FORCEPS CUTTING 33CM 5MM (CUTTING FORCEPS) IMPLANT
GLOVE BIO SURGEON STRL SZ 6.5 (GLOVE) ×4 IMPLANT
GLOVE BIO SURGEON STRL SZ7 (GLOVE) ×4 IMPLANT
GLOVE BIOGEL PI IND STRL 7.0 (GLOVE) ×6 IMPLANT
GLOVE BIOGEL PI INDICATOR 7.0 (GLOVE) ×2
GOWN PREVENTION PLUS LG XLONG (DISPOSABLE) ×4 IMPLANT
GOWN PREVENTION PLUS XXLARGE (GOWN DISPOSABLE) ×4 IMPLANT
NS IRRIG 1000ML POUR BTL (IV SOLUTION) ×4 IMPLANT
PACK ABDOMINAL GYN (CUSTOM PROCEDURE TRAY) ×4 IMPLANT
PACK LAPAROSCOPY BASIN (CUSTOM PROCEDURE TRAY) ×4 IMPLANT
PAD OB MATERNITY 4.3X12.25 (PERSONAL CARE ITEMS) ×4 IMPLANT
POUCH SPECIMEN RETRIEVAL 10MM (ENDOMECHANICALS) ×2 IMPLANT
PROTECTOR NERVE ULNAR (MISCELLANEOUS) ×4 IMPLANT
SEALER TISSUE G2 CVD JAW 35 (ENDOMECHANICALS) ×1 IMPLANT
SEALER TISSUE G2 CVD JAW 45CM (ENDOMECHANICALS) ×1
SET IRRIG TUBING LAPAROSCOPIC (IRRIGATION / IRRIGATOR) ×2 IMPLANT
SPONGE LAP 18X18 X RAY DECT (DISPOSABLE) ×8 IMPLANT
STAPLER VISISTAT 35W (STAPLE) ×4 IMPLANT
STRIP CLOSURE SKIN 1/2X4 (GAUZE/BANDAGES/DRESSINGS) ×2 IMPLANT
SUT CHROMIC 2 0 CT 1 (SUTURE) ×4 IMPLANT
SUT PLAIN 2 0 XLH (SUTURE) IMPLANT
SUT VIC AB 0 CT1 18XCR BRD8 (SUTURE) IMPLANT
SUT VIC AB 0 CT1 36 (SUTURE) ×8 IMPLANT
SUT VIC AB 0 CT1 8-18 (SUTURE)
SUT VIC AB 4-0 KS 27 (SUTURE) IMPLANT
SUT VICRYL 0 TIES 12 18 (SUTURE) IMPLANT
SUT VICRYL 0 UR6 27IN ABS (SUTURE) ×8 IMPLANT
SUT VICRYL 4-0 PS2 18IN ABS (SUTURE) ×4 IMPLANT
TOWEL OR 17X24 6PK STRL BLUE (TOWEL DISPOSABLE) ×8 IMPLANT
TRAY FOLEY CATH 14FR (SET/KITS/TRAYS/PACK) ×4 IMPLANT
TROCAR BALLN 12MMX100 BLUNT (TROCAR) ×6 IMPLANT
TROCAR XCEL NON-BLD 11X100MML (ENDOMECHANICALS) ×2 IMPLANT
TROCAR XCEL NON-BLD 5MMX100MML (ENDOMECHANICALS) ×2 IMPLANT
WARMER LAPAROSCOPE (MISCELLANEOUS) ×4 IMPLANT
WATER STERILE IRR 1000ML POUR (IV SOLUTION) ×4 IMPLANT

## 2012-07-08 NOTE — Anesthesia Postprocedure Evaluation (Signed)
  Anesthesia Post Note  Patient: Elizabeth Chan  Procedure(s) Performed: Procedure(s) (LRB): LAPAROSCOPY OPERATIVE (N/A) SALPINGO OOPHORECTOMY (Bilateral)  Anesthesia type: GA  Patient location: PACU  Post pain: Pain level controlled  Post assessment: Post-op Vital signs reviewed  Last Vitals:  Filed Vitals:   07/08/12 1317  BP:   Pulse:   Temp: 36.9 C  Resp: 22    Post vital signs: Reviewed  Level of consciousness: sedated  Complications: No apparent anesthesia complications

## 2012-07-08 NOTE — Discharge Summary (Signed)
Physician Discharge Summary  Patient ID: Elizabeth Chan MRN: [id number] DOB/AGE: [birth year] 67 y.o.  Admit date: 07/08/2012 Discharge date: 07/08/2012  Admission Diagnoses: Ovarian cyst  Discharge Diagnoses: same Active Problems:   * No active hospital problems. *   Discharged Condition: good  Hospital Course: Out pt surgery for ovarian cyst  Consults: None  Significant Diagnostic Studies: none  Treatments: surgery: LS BSO  Discharge Exam: Blood pressure 109/90, pulse 60, temperature 98.3 F (36.8 C), temperature source Oral, resp. rate 17, height 5\' 2"  (1.575 m), weight 77.111 kg (170 lb), SpO2 100.00%. General appearance: alert and cooperative GI: soft, non-tender; bowel sounds normal; no masses,  no organomegaly, incisions nl  Disposition: Home  Discharge Orders   Future Orders Complete By Expires     Call MD for:  difficulty breathing, headache or visual disturbances  As directed     Call MD for:  extreme fatigue  As directed     Call MD for:  hives  As directed     Call MD for:  persistant dizziness or light-headedness  As directed     Call MD for:  persistant nausea and vomiting  As directed     Call MD for:  redness, tenderness, or signs of infection (pain, swelling, redness, odor or green/yellow discharge around incision site)  As directed     Call MD for:  severe uncontrolled pain  As directed     Call MD for:  temperature >100.4  As directed     Diet - low sodium heart healthy  As directed     Increase activity slowly  As directed         Medication List    TAKE these medications       CALCIUM 600 + D PO  Take 2 tablets by mouth daily.     CENTRUM SILVER PO  Take 1 tablet by mouth daily.     levocetirizine 5 MG tablet  Commonly known as:  XYZAL  Take 5 mg by mouth every evening.     MENOPAUSE FORMULA PO  Take 1 tablet by mouth at bedtime.     montelukast 10 MG tablet  Commonly known as:  SINGULAIR  Take 10 mg by mouth at bedtime.     nebivolol 5 MG tablet  Commonly known as:  BYSTOLIC  Take 5 mg by mouth daily.     triamterene-hydrochlorothiazide 37.5-25 MG per tablet  Commonly known as:  MAXZIDE-25  Take 0.5 tablets by mouth daily as needed. For fluid         Signed: Fortino Sic 07/08/2012, 2:32 PM

## 2012-07-08 NOTE — H&P (View-Only) (Signed)
Elizabeth Chan is an 67 y.o. female G3 P2 A1 s/p hysterectomy in 1990 for fibroids who came in for a routine gyn exam in December and complained of backache and pelvic pressure on exam.  Ultrasound revealed a 64 x 41 x 72 mm mass, benign appearing.  The ovarian tumor marker profile was normal except the AFP was slightly elevated. Dr Stanford Breed, Gyn Oncologist was consulted.  CT scan was consistent with a benign cyst and abdominal and pelvic organs otherwise appeared normal.  Elizabeth Chan was given the option of having surgery in [facility] with Dr Stanford Breed and opted to have it here.  Risks, possible complications have been discussed including the possibility of occult malignancy requiring additional procedures at a later date have been discussed and informed consent was given for LS BSO and possible laparotomy.    Pertinent Gynecological History: Menses: post-menopausal and hyst Bleeding: no Contraception: status post hysterectomy DES exposure: denies Blood transfusions: none Sexually transmitted diseases: no past history Previous GYN Procedures: hyst 1990 fibroids  Last mammogram: normal Date: 2013 Last pap: normal Date: 2010    Menstrual History: Menarche age: na No LMP recorded. Patient has had a hysterectomy.    Past Medical History  Diagnosis Date  . Fibroids   . Pelvic mass in female   . No pertinent past medical history     Past Surgical History  Procedure Laterality Date  . Abdominal hysterectomy  1990    for fibroids  . Inner ear surgery  2007    Family History  Problem Relation Age of Onset  . Hypertension Mother   . Diabetes Sister   . Breast cancer Maternal Aunt     Social History:  reports that she has never smoked. She has never used smokeless tobacco. She reports that she does not drink alcohol or use illicit drugs.  Allergies:  Allergies  Allergen Reactions  . Doxycycline Itching     (Not in a hospital admission)  ROS Non  contributory  Physical exam  General  No distress, well developed HEENT nl Chest clear Heart S1 and S2 clear Abd BS present, no masses Ext nl Pelvic fullness near cul de sac, slightly tender Skin  Lichen planus      No results found.  Assessment/Plan: Right ovarian cyst, slightly elevated AFP P: LS BSO, possible laparotomy   Elizabeth Chan E 07/07/2012, 10:51 PM

## 2012-07-08 NOTE — Anesthesia Preprocedure Evaluation (Addendum)
Anesthesia Evaluation  Patient identified by MRN, date of birth, ID band Patient awake    Reviewed: Allergy & Precautions, H&P , Patient's Chart, lab work & pertinent test results, reviewed documented beta blocker date and time   History of Anesthesia Complications Negative for: history of anesthetic complications  Airway Mallampati: III TM Distance: >3 FB Neck ROM: full    Dental no notable dental hx.    Pulmonary neg pulmonary ROS,  breath sounds clear to auscultation  Pulmonary exam normal       Cardiovascular Exercise Tolerance: Good negative cardio ROS  Rhythm:regular Rate:Normal     Neuro/Psych negative neurological ROS  negative psych ROS   GI/Hepatic negative GI ROS, Neg liver ROS,   Endo/Other  negative endocrine ROS  Renal/GU negative Renal ROS     Musculoskeletal   Abdominal   Peds  Hematology negative hematology ROS (+)   Anesthesia Other Findings Fibroids     Pelvic mass in female        No pertinent past medical history    Reproductive/Obstetrics negative OB ROS                          Anesthesia Physical Anesthesia Plan  ASA: II  Anesthesia Plan: General ETT   Post-op Pain Management:    Induction:   Airway Management Planned:   Additional Equipment:   Intra-op Plan:   Post-operative Plan:   Informed Consent: I have reviewed the patients History and Physical, chart, labs and discussed the procedure including the risks, benefits and alternatives for the proposed anesthesia with the patient or authorized representative who has indicated his/her understanding and acceptance.   Dental Advisory Given  Plan Discussed with: CRNA and Surgeon  Anesthesia Plan Comments:         Anesthesia Quick Evaluation

## 2012-07-08 NOTE — Transfer of Care (Signed)
Immediate Anesthesia Transfer of Care Note  Patient: Elizabeth Chan  Procedure(s) Performed: Procedure(s): LAPAROSCOPY OPERATIVE (N/A) SALPINGO OOPHORECTOMY (Bilateral)  Patient Location: PACU  Anesthesia Type:General  Level of Consciousness: awake, alert  and oriented  Airway & Oxygen Therapy: Patient Spontanous Breathing and Patient connected to nasal cannula oxygen  Post-op Assessment: Report given to PACU RN, Post -op Vital signs reviewed and stable and Patient moving all extremities X 4  Post vital signs: Reviewed and stable  Complications: No apparent anesthesia complications

## 2012-07-08 NOTE — Interval H&P Note (Signed)
History and Physical Interval Note:  07/08/2012 10:13 AM  Elizabeth Chan  has presented today for surgery, with the diagnosis of mass in pelvis, 58544, possible 49000  The various methods of treatment have been discussed with the patient and family. After consideration of risks, benefits and other options for treatment, the patient has consented to  Procedure(s): LAPAROSCOPY OPERATIVE (N/A) SALPINGO OOPHORECTOMY (N/A) EXPLORATORY LAPAROTOMY (N/A) as a surgical intervention .  The patient's history has been reviewed, patient examined, no change in status, stable for surgery.  I have reviewed the patient's chart and labs.  Questions were answered to the patient's satisfaction.     Zaviyar Rahal E  H & P updated. No change.

## 2012-07-08 NOTE — Preoperative (Signed)
Beta Blockers   Reason not to administer Beta Blockers:Not Applicable 

## 2012-07-08 NOTE — Op Note (Signed)
07/08/2012  2:50 PM  PATIENT:  Elizabeth Chan  67 y.o. female  PRE-OPERATIVE DIAGNOSIS:  mass in pelvis, 58544, possible 49000  POST-OPERATIVE DIAGNOSIS:  mass in pelvis  PROCEDURE:  Procedure(s): LAPAROSCOPY OPERATIVE SALPINGO OOPHORECTOMY    The patient was placed on the table in supine position after general anesthesia was induced.She was then placed in the dorsolithotomy position.  The abdomen and vagina were sterilely prepped and draped in the usual fashion.  The bladder was drained with a Foley catheter.  An infraumbilical incision was made and the fascia was identified.  A suture was placed some and the fascia and the fascia was directly opened.  A 1/11 Hasson laparoscope was directly placed through the fascia.CO2 was insufflated into the abdomen.  A second puncture was placed him 25 mm ports were placed laterally without difficulty.  The abdomen was visualize the right ovary was enlarged, approximately 7 by 4-5 cm with a cyst.The right ovary was atrophic.  The procedure was initiated by identifying the ureters on both sides and confirming that the  Proposed Transection lines were well away from the ureters.  In the left tube and ovary was grasped and using a second grasper we were able to visualize an adhesion along the left pelvic sidewall.  Using the enseal device.  These adhesions, which were filmy and were incised.  The infundibulopelvic ligament was transected using the enseal device.  We then directed our attention to the right side and the exact procedure was done to free up the right ovary.  At this point the laparoscopic pouch was inserted and the right ovary and tube was inserted inside.  The pouch was brought up to the abdominal wall A cyst aspirator was inserted into the cyst and approximately 20 cc was drained from the cyst and the pouch was removed and the specimen was handed off to be sent for frozen section.The remaining ovary and tube was removed and sent to pathology.   Present in section report was benign. So the procedure was terminated.The fascia was closed with 0 Vicryl in an interrupted fashion in the midline.  The 5 mm ports were closed with a Steri-Strip the midline incision.  The skin was closed with 4-0 Vicryl in an interrupted fashion.  Of dictation.  Sponge, and instrument counts were correct  SURGEON:  Surgeon(s): Fortino Sic, MD Delbert Harness, MD   ASSISTANTS: Dr Christell Constant   ANESTHESIA:   general  EBL:  Total I/O In: 1700 [I.V.:1700] Out: 525 [Urine:450; Blood:75]  BLOOD ADMINISTERED:none  DRAINS: Urinary Catheter (Foley)   LOCAL MEDICATIONS USED:  MARCAINE     SPECIMEN:  Source of Specimen:  ovaries and tubes  DISPOSITION OF SPECIMEN:  PATHOLOGY  COUNTS:  YES  TOURNIQUET:  * No tourniquets in log *  DICTATION: .Dragon Dictation  PLAN OF CARE: Discharge to home after PACU  PATIENT DISPOSITION:  PACU - hemodynamically stable.   Delay start of Pharmacological VTE agent (>24hrs) due to surgical blood loss or risk of bleeding:  {YES/NO/NOT APPLICABLE:20182

## 2012-07-09 ENCOUNTER — Encounter (HOSPITAL_COMMUNITY): Payer: Self-pay | Admitting: Obstetrics and Gynecology

## 2012-07-23 ENCOUNTER — Other Ambulatory Visit: Payer: Self-pay

## 2012-07-23 DIAGNOSIS — Z1231 Encounter for screening mammogram for malignant neoplasm of breast: Secondary | ICD-10-CM

## 2012-08-15 ENCOUNTER — Ambulatory Visit
Admission: RE | Admit: 2012-08-15 | Discharge: 2012-08-15 | Disposition: A | Discharge status: Home / Self Care | Payer: BC Managed Care – PPO | Source: Ambulatory Visit

## 2012-08-15 DIAGNOSIS — Z1231 Encounter for screening mammogram for malignant neoplasm of breast: Secondary | ICD-10-CM

## 2012-09-18 DIAGNOSIS — L661 Lichen planopilaris, unspecified: Secondary | ICD-10-CM | POA: Insufficient documentation

## 2012-09-30 ENCOUNTER — Encounter: Payer: Self-pay | Admitting: Hematology

## 2012-10-21 DIAGNOSIS — H6120 Impacted cerumen, unspecified ear: Secondary | ICD-10-CM | POA: Insufficient documentation

## 2012-11-11 DIAGNOSIS — L439 Lichen planus, unspecified: Secondary | ICD-10-CM | POA: Insufficient documentation

## 2013-09-22 ENCOUNTER — Other Ambulatory Visit: Payer: Self-pay

## 2013-09-22 DIAGNOSIS — Z1231 Encounter for screening mammogram for malignant neoplasm of breast: Secondary | ICD-10-CM

## 2013-10-10 ENCOUNTER — Encounter (INDEPENDENT_AMBULATORY_CARE_PROVIDER_SITE_OTHER): Payer: Self-pay

## 2013-10-10 ENCOUNTER — Ambulatory Visit
Admission: RE | Admit: 2013-10-10 | Discharge: 2013-10-10 | Disposition: A | Discharge status: Home / Self Care | Payer: BC Managed Care – PPO | Source: Ambulatory Visit

## 2013-10-10 DIAGNOSIS — Z1231 Encounter for screening mammogram for malignant neoplasm of breast: Secondary | ICD-10-CM

## 2013-10-14 ENCOUNTER — Other Ambulatory Visit: Payer: Self-pay | Admitting: Obstetrics and Gynecology

## 2013-10-14 DIAGNOSIS — R928 Other abnormal and inconclusive findings on diagnostic imaging of breast: Secondary | ICD-10-CM

## 2013-10-20 ENCOUNTER — Ambulatory Visit
Admission: RE | Admit: 2013-10-20 | Discharge: 2013-10-20 | Disposition: A | Discharge status: Home / Self Care | Payer: BC Managed Care – PPO | Source: Ambulatory Visit | Attending: Obstetrics and Gynecology | Admitting: Obstetrics and Gynecology

## 2013-10-20 DIAGNOSIS — R928 Other abnormal and inconclusive findings on diagnostic imaging of breast: Secondary | ICD-10-CM

## 2013-10-21 ENCOUNTER — Other Ambulatory Visit: Payer: Self-pay | Admitting: Obstetrics and Gynecology

## 2013-10-22 ENCOUNTER — Other Ambulatory Visit: Payer: Self-pay | Admitting: Obstetrics and Gynecology

## 2014-01-02 ENCOUNTER — Other Ambulatory Visit: Payer: Self-pay | Admitting: Obstetrics and Gynecology

## 2014-01-02 DIAGNOSIS — R2232 Localized swelling, mass and lump, left upper limb: Secondary | ICD-10-CM

## 2014-01-30 ENCOUNTER — Other Ambulatory Visit: Payer: BC Managed Care – PPO

## 2014-02-02 ENCOUNTER — Other Ambulatory Visit: Payer: BC Managed Care – PPO

## 2014-02-25 ENCOUNTER — Encounter (INDEPENDENT_AMBULATORY_CARE_PROVIDER_SITE_OTHER): Payer: Self-pay

## 2014-02-25 ENCOUNTER — Ambulatory Visit
Admission: RE | Admit: 2014-02-25 | Discharge: 2014-02-25 | Disposition: A | Discharge status: Home / Self Care | Payer: BC Managed Care – PPO | Source: Ambulatory Visit | Attending: Obstetrics and Gynecology | Admitting: Obstetrics and Gynecology

## 2014-02-25 DIAGNOSIS — R2232 Localized swelling, mass and lump, left upper limb: Secondary | ICD-10-CM

## 2014-06-24 ENCOUNTER — Other Ambulatory Visit: Payer: Self-pay | Admitting: Obstetrics and Gynecology

## 2014-06-24 DIAGNOSIS — N6459 Other signs and symptoms in breast: Secondary | ICD-10-CM

## 2014-06-24 DIAGNOSIS — N63 Unspecified lump in unspecified breast: Secondary | ICD-10-CM

## 2014-06-29 ENCOUNTER — Ambulatory Visit
Admission: RE | Admit: 2014-06-29 | Discharge: 2014-06-29 | Disposition: A | Discharge status: Home / Self Care | Payer: BC Managed Care – PPO | Source: Ambulatory Visit | Attending: Obstetrics and Gynecology | Admitting: Obstetrics and Gynecology

## 2014-06-29 ENCOUNTER — Other Ambulatory Visit: Payer: Self-pay | Admitting: Obstetrics and Gynecology

## 2014-06-29 DIAGNOSIS — N6459 Other signs and symptoms in breast: Secondary | ICD-10-CM

## 2014-09-28 ENCOUNTER — Other Ambulatory Visit: Payer: Self-pay | Admitting: Obstetrics and Gynecology

## 2014-09-28 DIAGNOSIS — R2232 Localized swelling, mass and lump, left upper limb: Secondary | ICD-10-CM

## 2014-10-05 ENCOUNTER — Ambulatory Visit
Admission: RE | Admit: 2014-10-05 | Discharge: 2014-10-05 | Disposition: A | Discharge status: Home / Self Care | Payer: BC Managed Care – PPO | Source: Ambulatory Visit | Attending: Obstetrics and Gynecology | Admitting: Obstetrics and Gynecology

## 2014-10-05 DIAGNOSIS — R2232 Localized swelling, mass and lump, left upper limb: Secondary | ICD-10-CM

## 2015-09-01 ENCOUNTER — Other Ambulatory Visit: Payer: Self-pay

## 2015-09-01 DIAGNOSIS — Z1231 Encounter for screening mammogram for malignant neoplasm of breast: Secondary | ICD-10-CM

## 2015-10-11 ENCOUNTER — Ambulatory Visit: Payer: BC Managed Care – PPO

## 2015-11-16 ENCOUNTER — Ambulatory Visit: Payer: BC Managed Care – PPO

## 2015-11-22 ENCOUNTER — Ambulatory Visit
Admission: RE | Admit: 2015-11-22 | Discharge: 2015-11-22 | Disposition: A | Discharge status: Home / Self Care | Payer: BC Managed Care – PPO | Source: Ambulatory Visit

## 2015-11-22 DIAGNOSIS — Z1231 Encounter for screening mammogram for malignant neoplasm of breast: Secondary | ICD-10-CM

## 2016-10-20 ENCOUNTER — Other Ambulatory Visit: Payer: Self-pay | Admitting: Obstetrics and Gynecology

## 2016-10-20 DIAGNOSIS — Z1231 Encounter for screening mammogram for malignant neoplasm of breast: Secondary | ICD-10-CM

## 2016-11-28 ENCOUNTER — Ambulatory Visit
Admission: RE | Admit: 2016-11-28 | Discharge: 2016-11-28 | Disposition: A | Discharge status: Home / Self Care | Payer: BC Managed Care – PPO | Source: Ambulatory Visit | Attending: Obstetrics and Gynecology | Admitting: Obstetrics and Gynecology

## 2016-11-28 DIAGNOSIS — Z1231 Encounter for screening mammogram for malignant neoplasm of breast: Secondary | ICD-10-CM

## 2017-11-19 ENCOUNTER — Other Ambulatory Visit: Payer: Self-pay | Admitting: Obstetrics and Gynecology

## 2017-11-19 DIAGNOSIS — Z1231 Encounter for screening mammogram for malignant neoplasm of breast: Secondary | ICD-10-CM

## 2017-12-14 ENCOUNTER — Ambulatory Visit
Admission: RE | Admit: 2017-12-14 | Discharge: 2017-12-14 | Disposition: A | Discharge status: Home / Self Care | Payer: BC Managed Care – PPO | Source: Ambulatory Visit | Attending: Obstetrics and Gynecology | Admitting: Obstetrics and Gynecology

## 2017-12-14 DIAGNOSIS — Z1231 Encounter for screening mammogram for malignant neoplasm of breast: Secondary | ICD-10-CM

## 2018-03-25 DIAGNOSIS — R0981 Nasal congestion: Secondary | ICD-10-CM | POA: Insufficient documentation

## 2018-12-09 ENCOUNTER — Other Ambulatory Visit: Payer: Self-pay | Admitting: Internal Medicine

## 2018-12-09 DIAGNOSIS — Z1231 Encounter for screening mammogram for malignant neoplasm of breast: Secondary | ICD-10-CM

## 2019-01-28 ENCOUNTER — Other Ambulatory Visit: Payer: Self-pay

## 2019-01-28 ENCOUNTER — Ambulatory Visit
Admission: RE | Admit: 2019-01-28 | Discharge: 2019-01-28 | Disposition: A | Discharge status: Home / Self Care | Payer: BC Managed Care – PPO | Source: Ambulatory Visit | Attending: Internal Medicine | Admitting: Internal Medicine

## 2019-01-28 DIAGNOSIS — Z1231 Encounter for screening mammogram for malignant neoplasm of breast: Secondary | ICD-10-CM

## 2019-03-31 ENCOUNTER — Other Ambulatory Visit: Payer: Self-pay | Admitting: Internal Medicine

## 2019-03-31 ENCOUNTER — Ambulatory Visit
Admission: RE | Admit: 2019-03-31 | Discharge: 2019-03-31 | Disposition: A | Discharge status: Home / Self Care | Payer: BC Managed Care – PPO | Source: Ambulatory Visit | Attending: Internal Medicine | Admitting: Internal Medicine

## 2019-03-31 DIAGNOSIS — I159 Secondary hypertension, unspecified: Secondary | ICD-10-CM

## 2019-03-31 DIAGNOSIS — G4452 New daily persistent headache (NDPH): Secondary | ICD-10-CM

## 2019-04-15 ENCOUNTER — Other Ambulatory Visit: Payer: Self-pay | Admitting: Internal Medicine

## 2019-04-16 ENCOUNTER — Telehealth (HOSPITAL_COMMUNITY): Payer: Self-pay | Admitting: *Deleted

## 2019-04-16 NOTE — Telephone Encounter (Signed)
Dr. Forbes Cellar office was notified that we do not perform temporal u/s.

## 2019-04-23 ENCOUNTER — Ambulatory Visit (INDEPENDENT_AMBULATORY_CARE_PROVIDER_SITE_OTHER): Payer: BC Managed Care – PPO | Admitting: Vascular Surgery

## 2019-04-23 ENCOUNTER — Encounter: Payer: Self-pay | Admitting: *Deleted

## 2019-04-23 ENCOUNTER — Other Ambulatory Visit: Payer: Self-pay

## 2019-04-23 ENCOUNTER — Other Ambulatory Visit: Payer: Self-pay | Admitting: *Deleted

## 2019-04-23 ENCOUNTER — Encounter: Payer: Self-pay | Admitting: Vascular Surgery

## 2019-04-23 VITALS — BP 152/71 | HR 64 | Temp 97.9°F | Resp 20 | Ht 62.0 in | Wt 192.0 lb

## 2019-04-23 DIAGNOSIS — I10 Essential (primary) hypertension: Secondary | ICD-10-CM | POA: Insufficient documentation

## 2019-04-23 DIAGNOSIS — M316 Other giant cell arteritis: Secondary | ICD-10-CM | POA: Diagnosis not present

## 2019-04-23 DIAGNOSIS — J309 Allergic rhinitis, unspecified: Secondary | ICD-10-CM | POA: Insufficient documentation

## 2019-04-23 DIAGNOSIS — R519 Headache, unspecified: Secondary | ICD-10-CM | POA: Insufficient documentation

## 2019-04-23 NOTE — Progress Notes (Signed)
REASON FOR CONSULT:    To evaluate for temporal artery biopsy.  The consult is requested by Dr. Katy Fitch.  ASSESSMENT & PLAN:   POSSIBLE TEMPORAL ARTERITIS: Certainly the patient's symptoms and elevated sed rate could be consistent with temporal arteritis.  Her symptoms appear to be mostly on the left side so I have recommended left temporal artery biopsy.  She will need Covid testing preoperatively so we will have to schedule this early next week.  We have scheduled her for Tuesday, 04/29/2019.  I have discussed the indications for the procedure, that is to evaluate for temporal arteritis.  If she does have temporal arteritis this will help direct therapy and she will need to continue her steroids.  If the biopsy is negative, then her steroids can be discontinued and her work-up can be focused on other potential diagnoses.  I have also discussed the potential complications.  All of her questions were answered and she is agreeable to proceed.  Deitra Mayo, MD Office: [phone]   HPI:   Elizabeth Chan is a pleasant 73 y.o. female, who in November developed a headache that was mostly at the superior aspect of her head and then began to settle more on the left side over the temporal area.  She was started on prednisone November 12.  Currently her symptoms have resolved.  Her work-up included a sed rate which was elevated at 75.  She denies any fatigue, fever, myalgias, anorexia, or visual disturbances.  Her complaints were really the headache and some tenderness over the top of the skull and the left temporal area.  I have reviewed the records from the referring office.  The patient was seen on 04/03/2019.  She was having a headache at that time.  Some labs were ordered to rule out giant cell arteritis.  She was to follow-up with her allergist.  I reviewed her labs from 04/03/2019 which showed a sed rate of 73.  C-reactive protein was 11.  The patient was referred for possible temporal artery  biopsy.  Past Medical History:  Diagnosis Date  . Abnormal EKG   . Allergic rhinitis   . Fibroids   . Generalized headaches   . H/O ovarian cystectomy   . Hypertension   . No pertinent past medical history   . Obesity   . Pelvic mass in female     Family History  Problem Relation Age of Onset  . Hypertension Mother   . Diabetes Sister   . Breast cancer Maternal Aunt     SOCIAL HISTORY: Social History   Socioeconomic History  . Marital status: Widowed    Spouse name: Not on file  . Number of children: Not on file  . Years of education: Not on file  . Highest education level: Not on file  Occupational History  . Not on file  Social Needs  . Financial resource strain: Not on file  . Food insecurity    Worry: Not on file    Inability: Not on file  . Transportation needs    Medical: Not on file    Non-medical: Not on file  Tobacco Use  . Smoking status: Never Smoker  . Smokeless tobacco: Never Used  Substance and Sexual Activity  . Alcohol use: No  . Drug use: No  . Sexual activity: Not on file  Lifestyle  . Physical activity    Days per week: Not on file    Minutes per session: Not on file  . Stress:  Not on file  Relationships  . Social Herbalist on phone: Not on file    Gets together: Not on file    Attends religious service: Not on file    Active member of club or organization: Not on file    Attends meetings of clubs or organizations: Not on file    Relationship status: Not on file  . Intimate partner violence    Fear of current or ex partner: Not on file    Emotionally abused: Not on file    Physically abused: Not on file    Forced sexual activity: Not on file  Other Topics Concern  . Not on file  Social History Narrative  . Not on file    Allergies  Allergen Reactions  . Doxycycline Itching  . Erythromycin Diarrhea  . Amoxicillin Dermatitis  . Fluconazole Rash    Current Outpatient Medications  Medication Sig Dispense Refill   . amLODipine (NORVASC) 5 MG tablet Take by mouth.    Marland Kitchen ascorbic acid (VITAMIN C) 1000 MG tablet Take by mouth.    . BYSTOLIC 20 MG TABS     . cetirizine (ZYRTEC) 10 MG tablet Take 10 mg by mouth daily.    . magnesium 30 MG tablet Take 30 mg by mouth daily.    . montelukast (SINGULAIR) 10 MG tablet     . Multiple Vitamins-Minerals (CENTRUM SILVER PO) Take 1 tablet by mouth daily.    . Nutritional Supplements (MENOPAUSE FORMULA PO) Take 1 tablet by mouth at bedtime.    . predniSONE (DELTASONE) 10 MG tablet Take 10 mg by mouth daily.    . predniSONE (DELTASONE) 20 MG tablet Take 20 mg by mouth daily.    Marland Kitchen triamterene-hydrochlorothiazide (MAXZIDE-25) 37.5-25 MG per tablet Take 0.5 tablets by mouth daily as needed. For fluid    . ASMANEX HFA 50 MCG/ACT AERO     . Calcium Carbonate-Vitamin D (CALCIUM 600 + D PO) Take 2 tablets by mouth daily.    . Cyanocobalamin (VITAMIN B 12 PO) Take by mouth daily.     No current facility-administered medications for this visit.     REVIEW OF SYSTEMS:  [X]  denotes positive finding, [ ]  denotes negative finding Cardiac  Comments:  Chest pain or chest pressure:    Shortness of breath upon exertion:    Short of breath when lying flat:    Irregular heart rhythm:        Vascular    Pain in calf, thigh, or hip brought on by ambulation:    Pain in feet at night that wakes you up from your sleep:     Blood clot in your veins:    Leg swelling:         Pulmonary    Oxygen at home:    Productive cough:     Wheezing:         Neurologic    Sudden weakness in arms or legs:     Sudden numbness in arms or legs:     Sudden onset of difficulty speaking or slurred speech:    Temporary loss of vision in one eye:     Problems with dizziness:         Gastrointestinal    Blood in stool:     Vomited blood:         Genitourinary    Burning when urinating:     Blood in urine:        Psychiatric  Major depression:         Hematologic    Bleeding  problems:    Problems with blood clotting too easily:        Skin    Rashes or ulcers:        Constitutional    Fever or chills:     PHYSICAL EXAM:   Vitals:   04/23/19 1537  BP: (!) 152/71  Pulse: 64  Resp: 20  Temp: 97.9 F (36.6 C)  SpO2: 100%  Weight: 192 lb (87.1 kg)  Height: 5\' 2"  (1.575 m)    GENERAL: The patient is a well-nourished female, in no acute distress. The vital signs are documented above. CARDIAC: There is a regular rate and rhythm.  VASCULAR: I do not detect carotid bruits. She has mild tenderness over the left temporal region. She does have a pulse in the left temporal artery. PULMONARY: There is good air exchange bilaterally without wheezing or rales. MUSCULOSKELETAL: There are no major deformities or cyanosis. NEUROLOGIC: No focal weakness or paresthesias are detected. SKIN: There are no ulcers or rashes noted. PSYCHIATRIC: The patient has a normal affect.  DATA:    LABS: Sed rate is 75

## 2019-04-25 ENCOUNTER — Other Ambulatory Visit: Payer: Self-pay

## 2019-04-25 ENCOUNTER — Encounter (HOSPITAL_COMMUNITY): Payer: Self-pay | Admitting: *Deleted

## 2019-04-25 NOTE — Progress Notes (Signed)
Spoke with pt for pre-op call. Pt denies cardiac history or Diabetes. Pt is treated for HTN.  Pt will get her Covid test done tomorrow, 04/26/19. Pt voiced understanding of quarantine instructions.

## 2019-04-26 ENCOUNTER — Other Ambulatory Visit (HOSPITAL_COMMUNITY)
Admission: RE | Admit: 2019-04-26 | Discharge: 2019-04-26 | Disposition: A | Discharge status: Home / Self Care | Payer: BC Managed Care – PPO | Source: Ambulatory Visit | Attending: Vascular Surgery | Admitting: Vascular Surgery

## 2019-04-26 DIAGNOSIS — Z20828 Contact with and (suspected) exposure to other viral communicable diseases: Secondary | ICD-10-CM | POA: Diagnosis not present

## 2019-04-26 DIAGNOSIS — Z01812 Encounter for preprocedural laboratory examination: Secondary | ICD-10-CM | POA: Insufficient documentation

## 2019-04-26 LAB — SARS CORONAVIRUS 2 (TAT 6-24 HRS): SARS Coronavirus 2: NEGATIVE

## 2019-04-29 ENCOUNTER — Encounter (HOSPITAL_COMMUNITY): Payer: Self-pay | Admitting: *Deleted

## 2019-04-29 ENCOUNTER — Ambulatory Visit (HOSPITAL_COMMUNITY): Payer: BC Managed Care – PPO | Admitting: Certified Registered Nurse Anesthetist

## 2019-04-29 ENCOUNTER — Ambulatory Visit (HOSPITAL_COMMUNITY)
Admission: RE | Admit: 2019-04-29 | Discharge: 2019-04-29 | Disposition: A | Discharge status: Home / Self Care | Payer: BC Managed Care – PPO | Attending: Vascular Surgery | Admitting: Vascular Surgery

## 2019-04-29 ENCOUNTER — Encounter (HOSPITAL_COMMUNITY)
Admission: RE | Disposition: A | Discharge status: Home / Self Care | Payer: Self-pay | Source: Home / Self Care | Attending: Vascular Surgery

## 2019-04-29 ENCOUNTER — Other Ambulatory Visit: Payer: Self-pay

## 2019-04-29 DIAGNOSIS — Z7952 Long term (current) use of systemic steroids: Secondary | ICD-10-CM | POA: Insufficient documentation

## 2019-04-29 DIAGNOSIS — R700 Elevated erythrocyte sedimentation rate: Secondary | ICD-10-CM | POA: Insufficient documentation

## 2019-04-29 DIAGNOSIS — Z881 Allergy status to other antibiotic agents status: Secondary | ICD-10-CM | POA: Diagnosis not present

## 2019-04-29 DIAGNOSIS — Z79899 Other long term (current) drug therapy: Secondary | ICD-10-CM | POA: Insufficient documentation

## 2019-04-29 DIAGNOSIS — I10 Essential (primary) hypertension: Secondary | ICD-10-CM | POA: Insufficient documentation

## 2019-04-29 DIAGNOSIS — R519 Headache, unspecified: Secondary | ICD-10-CM | POA: Insufficient documentation

## 2019-04-29 DIAGNOSIS — Z888 Allergy status to other drugs, medicaments and biological substances status: Secondary | ICD-10-CM | POA: Diagnosis not present

## 2019-04-29 DIAGNOSIS — Z8249 Family history of ischemic heart disease and other diseases of the circulatory system: Secondary | ICD-10-CM | POA: Insufficient documentation

## 2019-04-29 DIAGNOSIS — I672 Cerebral atherosclerosis: Secondary | ICD-10-CM | POA: Insufficient documentation

## 2019-04-29 HISTORY — DX: Bursopathy, unspecified: M71.9

## 2019-04-29 HISTORY — PX: ARTERY BIOPSY: SHX891

## 2019-04-29 LAB — POCT I-STAT, CHEM 8
BUN: 18 mg/dL (ref 8–23)
Calcium, Ion: 1.27 mmol/L (ref 1.15–1.40)
Chloride: 98 mmol/L (ref 98–111)
Creatinine, Ser: 1 mg/dL (ref 0.44–1.00)
Glucose, Bld: 91 mg/dL (ref 70–99)
HCT: 40 % (ref 36.0–46.0)
Hemoglobin: 13.6 g/dL (ref 12.0–15.0)
Potassium: 3.8 mmol/L (ref 3.5–5.1)
Sodium: 135 mmol/L (ref 135–145)
TCO2: 32 mmol/L (ref 22–32)

## 2019-04-29 SURGERY — BIOPSY TEMPORAL ARTERY
Anesthesia: Monitor Anesthesia Care | Laterality: Left

## 2019-04-29 MED ORDER — LACTATED RINGERS IV SOLN
INTRAVENOUS | Status: DC
  Administered 2019-04-29: 08:00:00 via INTRAVENOUS

## 2019-04-29 MED ORDER — PROPOFOL 500 MG/50ML IV EMUL
INTRAVENOUS | Status: DC | PRN
  Administered 2019-04-29: 100 ug/kg/min via INTRAVENOUS

## 2019-04-29 MED ORDER — LIDOCAINE HCL (PF) 1 % IJ SOLN
INTRAMUSCULAR | Status: DC | PRN
  Administered 2019-04-29: 3 mL

## 2019-04-29 MED ORDER — ONDANSETRON HCL 4 MG/2ML IJ SOLN
INTRAMUSCULAR | Status: AC
  Filled 2019-04-29: qty 2

## 2019-04-29 MED ORDER — SODIUM CHLORIDE 0.9 % IV SOLN: INTRAVENOUS | Status: DC

## 2019-04-29 MED ORDER — FENTANYL CITRATE (PF) 250 MCG/5ML IJ SOLN
INTRAMUSCULAR | Status: AC
  Filled 2019-04-29: qty 5

## 2019-04-29 MED ORDER — VANCOMYCIN HCL IN DEXTROSE 1-5 GM/200ML-% IV SOLN
INTRAVENOUS | Status: AC
  Administered 2019-04-29: 1000 mg via INTRAVENOUS
  Filled 2019-04-29: qty 200

## 2019-04-29 MED ORDER — LIDOCAINE HCL (PF) 1 % IJ SOLN
INTRAMUSCULAR | Status: AC
  Filled 2019-04-29: qty 30

## 2019-04-29 MED ORDER — LIDOCAINE 2% (20 MG/ML) 5 ML SYRINGE
INTRAMUSCULAR | Status: DC | PRN
  Administered 2019-04-29: 60 mg via INTRAVENOUS

## 2019-04-29 MED ORDER — ONDANSETRON HCL 4 MG/2ML IJ SOLN: 4.0000 mg | Freq: Once | INTRAMUSCULAR | Status: DC | PRN

## 2019-04-29 MED ORDER — VANCOMYCIN HCL IN DEXTROSE 1-5 GM/200ML-% IV SOLN
1000.0000 mg | INTRAVENOUS | Status: AC
  Administered 2019-04-29: 08:00:00 1000 mg via INTRAVENOUS

## 2019-04-29 MED ORDER — FENTANYL CITRATE (PF) 100 MCG/2ML IJ SOLN: 25.0000 ug | INTRAMUSCULAR | Status: DC | PRN

## 2019-04-29 MED ORDER — 0.9 % SODIUM CHLORIDE (POUR BTL) OPTIME
TOPICAL | Status: DC | PRN
  Administered 2019-04-29: 1000 mL

## 2019-04-29 MED ORDER — MIDAZOLAM HCL 5 MG/5ML IJ SOLN
INTRAMUSCULAR | Status: DC | PRN
  Administered 2019-04-29 (×2): 1 mg via INTRAVENOUS

## 2019-04-29 MED ORDER — ONDANSETRON HCL 4 MG/2ML IJ SOLN
INTRAMUSCULAR | Status: DC | PRN
  Administered 2019-04-29: 4 mg via INTRAVENOUS

## 2019-04-29 MED ORDER — PROPOFOL 10 MG/ML IV BOLUS
INTRAVENOUS | Status: DC | PRN
  Administered 2019-04-29: 20 mg via INTRAVENOUS

## 2019-04-29 MED ORDER — MIDAZOLAM HCL 2 MG/2ML IJ SOLN
INTRAMUSCULAR | Status: AC
  Filled 2019-04-29: qty 2

## 2019-04-29 SURGICAL SUPPLY — 42 items
ADH SKN CLS APL DERMABOND .7 (GAUZE/BANDAGES/DRESSINGS) ×1
BALL CTTN LRG ABS STRL LF (GAUZE/BANDAGES/DRESSINGS)
CANISTER SUCT 3000ML PPV (MISCELLANEOUS) ×2 IMPLANT
CLIP VESOCCLUDE SM WIDE 6/CT (CLIP) ×2 IMPLANT
CONT SPEC 4OZ CLIKSEAL STRL BL (MISCELLANEOUS) ×2 IMPLANT
COTTONBALL LRG STERILE PKG (GAUZE/BANDAGES/DRESSINGS) ×1 IMPLANT
COVER SURGICAL LIGHT HANDLE (MISCELLANEOUS) ×2 IMPLANT
COVER WAND RF STERILE (DRAPES) ×1 IMPLANT
DECANTER SPIKE VIAL GLASS SM (MISCELLANEOUS) ×2 IMPLANT
DERMABOND ADVANCED (GAUZE/BANDAGES/DRESSINGS) ×1
DERMABOND ADVANCED .7 DNX12 (GAUZE/BANDAGES/DRESSINGS) ×1 IMPLANT
DRAPE OPHTHALMIC 77X100 STRL (CUSTOM PROCEDURE TRAY) ×2 IMPLANT
ELECT NDL BLADE 2-5/6 (NEEDLE) ×1 IMPLANT
ELECT NEEDLE BLADE 2-5/6 (NEEDLE) ×2 IMPLANT
ELECT REM PT RETURN 9FT ADLT (ELECTROSURGICAL) ×2
ELECTRODE REM PT RTRN 9FT ADLT (ELECTROSURGICAL) ×1 IMPLANT
GAUZE 4X4 16PLY RFD (DISPOSABLE) ×1 IMPLANT
GEL ULTRASOUND 8.5O AQUASONIC (MISCELLANEOUS) ×2 IMPLANT
GLOVE BIO SURGEON STRL SZ7.5 (GLOVE) ×2 IMPLANT
GOWN STRL REUS W/ TWL LRG LVL3 (GOWN DISPOSABLE) ×1 IMPLANT
GOWN STRL REUS W/ TWL XL LVL3 (GOWN DISPOSABLE) ×1 IMPLANT
GOWN STRL REUS W/TWL LRG LVL3 (GOWN DISPOSABLE) ×2
GOWN STRL REUS W/TWL XL LVL3 (GOWN DISPOSABLE) ×2
KIT BASIN OR (CUSTOM PROCEDURE TRAY) ×2 IMPLANT
KIT TURNOVER KIT B (KITS) ×2 IMPLANT
LOOP VESSEL MINI RED (MISCELLANEOUS) ×2 IMPLANT
NDL HYPO 25GX1X1/2 BEV (NEEDLE) ×1 IMPLANT
NEEDLE HYPO 25GX1X1/2 BEV (NEEDLE) ×2 IMPLANT
NS IRRIG 1000ML POUR BTL (IV SOLUTION) ×2 IMPLANT
PACK GENERAL/GYN (CUSTOM PROCEDURE TRAY) ×2 IMPLANT
PAD ARMBOARD 7.5X6 YLW CONV (MISCELLANEOUS) ×4 IMPLANT
SUCTION FRAZIER HANDLE 10FR (MISCELLANEOUS)
SUCTION TUBE FRAZIER 10FR DISP (MISCELLANEOUS) ×1 IMPLANT
SUT MNCRL AB 4-0 PS2 18 (SUTURE) ×2 IMPLANT
SUT PROLENE 6 0 BV (SUTURE) IMPLANT
SUT SILK 3 0 (SUTURE) ×2
SUT SILK 3-0 18XBRD TIE 12 (SUTURE) IMPLANT
SUT VIC AB 3-0 SH 27 (SUTURE) ×2
SUT VIC AB 3-0 SH 27X BRD (SUTURE) ×1 IMPLANT
SYR CONTROL 10ML LL (SYRINGE) ×2 IMPLANT
TOWEL GREEN STERILE (TOWEL DISPOSABLE) ×2 IMPLANT
WATER STERILE IRR 1000ML POUR (IV SOLUTION) ×2 IMPLANT

## 2019-04-29 NOTE — Anesthesia Preprocedure Evaluation (Addendum)
Anesthesia Evaluation  Patient identified by MRN, date of birth, ID band Patient awake    Reviewed: Allergy & Precautions, H&P , NPO status , Patient's Chart, lab work & pertinent test results, reviewed documented beta blocker date and time   History of Anesthesia Complications Negative for: history of anesthetic complications  Airway Mallampati: III  TM Distance: >3 FB Neck ROM: full    Dental no notable dental hx. (+) Teeth Intact, Dental Advisory Given   Pulmonary neg pulmonary ROS,    Pulmonary exam normal breath sounds clear to auscultation       Cardiovascular Exercise Tolerance: Good hypertension, Pt. on medications negative cardio ROS   Rhythm:regular Rate:Normal     Neuro/Psych  Headaches, Concern for temporal arteritis negative psych ROS   GI/Hepatic negative GI ROS, Neg liver ROS,   Endo/Other  negative endocrine ROS  Renal/GU negative Renal ROS   Uterine fibroids, hx ovarian cyst S/p TAHBSO    Musculoskeletal negative musculoskeletal ROS (+)   Abdominal Normal abdominal exam  (+)   Peds  Hematology negative hematology ROS (+)   Anesthesia Other Findings   Reproductive/Obstetrics negative OB ROS                            Anesthesia Physical  Anesthesia Plan  ASA: II  Anesthesia Plan: MAC   Post-op Pain Management:    Induction:   PONV Risk Score and Plan: 2 and Propofol infusion, TIVA and Treatment may vary due to age or medical condition  Airway Management Planned: Natural Airway and Simple Face Mask  Additional Equipment: None  Intra-op Plan:   Post-operative Plan:   Informed Consent: I have reviewed the patients History and Physical, chart, labs and discussed the procedure including the risks, benefits and alternatives for the proposed anesthesia with the patient or authorized representative who has indicated his/her understanding and acceptance.        Plan Discussed with: CRNA  Anesthesia Plan Comments:         Anesthesia Quick Evaluation

## 2019-04-29 NOTE — H&P (Signed)
   History and Physical Update  The patient was interviewed and re-examined.  The patient's previous History and Physical has been reviewed and is unchanged from recent office visit. Plan for left temporal artery biopsy today in OR.   Rasool Rommel C. [name], MD [facility] Office: [phone] Pager: [phone]  04/29/2019, 8:57 AM

## 2019-04-29 NOTE — Transfer of Care (Signed)
Immediate Anesthesia Transfer of Care Note  Patient: Elizabeth Chan  Procedure(s) Performed: LEFT TEMPORAL ARTERY BIOPSY (Left )  Patient Location: PACU  Anesthesia Type:MAC  Level of Consciousness: drowsy  Airway & Oxygen Therapy: Patient Spontanous Breathing and Patient connected to face mask oxygen  Post-op Assessment: Report given to RN and Post -op Vital signs reviewed and stable  Post vital signs: Reviewed  Last Vitals:  Vitals Value Taken Time  BP 126/79 04/29/19 0954  Temp    Pulse 49 04/29/19 0955  Resp 18 04/29/19 0955  SpO2 100 % 04/29/19 0955  Vitals shown include unvalidated device data.  Last Pain:  Vitals:   04/29/19 0741  PainSc: 2       Patients Stated Pain Goal: 5 (28/36/62 9476)  Complications: No apparent anesthesia complications

## 2019-04-29 NOTE — Anesthesia Procedure Notes (Signed)
Procedure Name: MAC Date/Time: 04/29/2019 9:10 AM Performed by: Janene Harvey, CRNA Pre-anesthesia Checklist: Patient identified, Emergency Drugs available, Suction available and Patient being monitored Oxygen Delivery Method: Simple face mask Dental Injury: Teeth and Oropharynx as per pre-operative assessment

## 2019-04-29 NOTE — Op Note (Signed)
    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 [name], MD Procedure Performed:  Left temporal artery biopsy  Indications: 73 year old female with left-sided headache and elevated sed rate concern for temporal arteritis.  She is indicated for temporal artery biopsy.  Findings: Temporal artery was removed for a total of 4 cm segment.   Procedure:  The patient was identified in the holding area and taken to the operating room where she is placed upon operative table and MAC anesthesia induced.  She was gently prepped draped left side of her face in usual fashion antibiotics were administered timeout was called.  We began by using Doppler to identify the artery anterior to the ear.  I then removed some hair around the area.  There is anesthetized 1% lidocaine.  Vertical incision was made.  Dissected down to the artery confirmed this with Doppler.  I clamped it distally and transected.  To have arterial flow anteriorly.  I clipped the proximally and sent off the specimen.  I tied it off proximally clipped distally.  I thoroughly irrigated the wound and closed the skin with 4-0 Monocryl.  Dermabond was placed at the level of the skin.  She was awakened from anesthesia having tolerated procedure without immediate complication but all counts were correct at completion.  EBL: 10 cc   [name] C. [name], MD [facility] Office: [phone] Pager: [phone]

## 2019-04-29 NOTE — Anesthesia Postprocedure Evaluation (Signed)
Anesthesia Post Note  Patient: Elizabeth Chan  Procedure(s) Performed: LEFT TEMPORAL ARTERY BIOPSY (Left )     Patient location during evaluation: PACU Anesthesia Type: MAC Level of consciousness: awake and alert Pain management: pain level controlled Vital Signs Assessment: post-procedure vital signs reviewed and stable Respiratory status: spontaneous breathing, nonlabored ventilation and respiratory function stable Cardiovascular status: blood pressure returned to baseline and stable Postop Assessment: no apparent nausea or vomiting Anesthetic complications: no    Last Vitals:  Vitals:   04/29/19 0954 04/29/19 0955  BP: 126/79   Pulse: (!) 54 (!) 49  Resp: 13 18  Temp: 36.5 C   SpO2: 96% 100%    Last Pain:  Vitals:   04/29/19 0954  PainSc: 0-No pain                 Jarome Matin Triton Heidrich

## 2019-04-29 NOTE — Discharge Instructions (Signed)
Incision Care, Adult °An incision is a cut that a doctor makes in your skin for surgery (for a procedure). Most times, these cuts are closed after surgery. Your cut from surgery may be closed with stitches (sutures), staples, skin glue, or skin tape (adhesive strips). You may need to return to your doctor to have stitches or staples taken out. This may happen many days or many weeks after your surgery. The cut needs to be well cared for so it does not get infected. °How to care for your cut °Cut care ° °· Follow instructions from your doctor about how to take care of your cut. Make sure you: °? Wash your hands with soap and water before you change your bandage (dressing). If you cannot use soap and water, use hand sanitizer. °? Change your bandage as told by your doctor. °? Leave stitches, skin glue, or skin tape in place. They may need to stay in place for 2 weeks or longer. If tape strips get loose and curl up, you may trim the loose edges. Do not remove tape strips completely unless your doctor says it is okay. °· Check your cut area every day for signs of infection. Check for: °? More redness, swelling, or pain. °? More fluid or blood. °? Warmth. °? Pus or a bad smell. °· Ask your doctor how to clean the cut. This may include: °? Using mild soap and water. °? Using a clean towel to pat the cut dry after you clean it. °? Putting a cream or ointment on the cut. Do this only as told by your doctor. °? Covering the cut with a clean bandage. °· Ask your doctor when you can leave the cut uncovered. °· Do not take baths, swim, or use a hot tub until your doctor says it is okay. Ask your doctor if you can take showers. You may only be allowed to take sponge baths for bathing. °Medicines °· If you were prescribed an antibiotic medicine, cream, or ointment, take the antibiotic or put it on the cut as told by your doctor. Do not stop taking or putting on the antibiotic even if your condition gets better. °· Take  over-the-counter and prescription medicines only as told by your doctor. °General instructions °· Limit movement around your cut. This helps healing. °? Avoid straining, lifting, or exercise for the first month, or for as long as told by your doctor. °? Follow instructions from your doctor about going back to your normal activities. °? Ask your doctor what activities are safe. °· Protect your cut from the sun when you are outside for the first 6 months, or for as long as told by your doctor. Put on sunscreen around the scar or cover up the scar. °· Keep all follow-up visits as told by your doctor. This is important. °Contact a doctor if: °· Your have more redness, swelling, or pain around the cut. °· You have more fluid or blood coming from the cut. °· Your cut feels warm to the touch. °· You have pus or a bad smell coming from the cut. °· You have a fever or shaking chills. °· You feel sick to your stomach (nauseous) or you throw up (vomit). °· You are dizzy. °· Your stitches or staples come undone. °Get help right away if: °· You have a red streak coming from your cut. °· Your cut bleeds through the bandage and the bleeding does not stop with gentle pressure. °· The edges of your cut   open up and separate. °· You have very bad (severe) pain. °· You have a rash. °· You are confused. °· You pass out (faint). °· You have trouble breathing and you have a fast heartbeat. °This information is not intended to replace advice given to you by your health care provider. Make sure you discuss any questions you have with your health care provider. °Document Released: 07/31/2011 Document Revised: 09/25/2016 Document Reviewed: 01/14/2016 °[employer]vier Patient Education © 2020 [employer] ° °

## 2019-04-30 ENCOUNTER — Encounter (HOSPITAL_COMMUNITY): Payer: Self-pay | Admitting: Vascular Surgery

## 2019-04-30 LAB — SURGICAL PATHOLOGY

## 2019-12-22 ENCOUNTER — Other Ambulatory Visit: Payer: Self-pay | Admitting: Internal Medicine

## 2019-12-22 DIAGNOSIS — Z1231 Encounter for screening mammogram for malignant neoplasm of breast: Secondary | ICD-10-CM

## 2020-02-02 ENCOUNTER — Ambulatory Visit
Admission: RE | Admit: 2020-02-02 | Discharge: 2020-02-02 | Disposition: A | Discharge status: Home / Self Care | Payer: BC Managed Care – PPO | Source: Ambulatory Visit | Attending: Internal Medicine | Admitting: Internal Medicine

## 2020-02-02 ENCOUNTER — Other Ambulatory Visit: Payer: Self-pay

## 2020-02-02 DIAGNOSIS — Z1231 Encounter for screening mammogram for malignant neoplasm of breast: Secondary | ICD-10-CM

## 2020-10-13 ENCOUNTER — Other Ambulatory Visit: Payer: Self-pay

## 2020-10-13 ENCOUNTER — Encounter (HOSPITAL_COMMUNITY): Payer: Self-pay

## 2020-10-13 ENCOUNTER — Emergency Department (HOSPITAL_COMMUNITY)
Admission: EM | Admit: 2020-10-13 | Discharge: 2020-10-13 | Disposition: A | Discharge status: Home / Self Care | Payer: BC Managed Care – PPO | Attending: Emergency Medicine | Admitting: Emergency Medicine

## 2020-10-13 ENCOUNTER — Emergency Department (HOSPITAL_COMMUNITY): Payer: BC Managed Care – PPO

## 2020-10-13 DIAGNOSIS — I10 Essential (primary) hypertension: Secondary | ICD-10-CM | POA: Insufficient documentation

## 2020-10-13 DIAGNOSIS — Z79899 Other long term (current) drug therapy: Secondary | ICD-10-CM | POA: Insufficient documentation

## 2020-10-13 DIAGNOSIS — R55 Syncope and collapse: Secondary | ICD-10-CM | POA: Insufficient documentation

## 2020-10-13 DIAGNOSIS — R42 Dizziness and giddiness: Secondary | ICD-10-CM | POA: Diagnosis present

## 2020-10-13 LAB — CBC WITH DIFFERENTIAL/PLATELET
Abs Immature Granulocytes: 0.04 10*3/uL (ref 0.00–0.07)
Basophils Absolute: 0 10*3/uL (ref 0.0–0.1)
Basophils Relative: 0 %
Eosinophils Absolute: 0 10*3/uL (ref 0.0–0.5)
Eosinophils Relative: 1 %
HCT: 35.9 % — ABNORMAL LOW (ref 36.0–46.0)
Hemoglobin: 12.1 g/dL (ref 12.0–15.0)
Immature Granulocytes: 1 %
Lymphocytes Relative: 19 %
Lymphs Abs: 1.5 10*3/uL (ref 0.7–4.0)
MCH: 30.9 pg (ref 26.0–34.0)
MCHC: 33.7 g/dL (ref 30.0–36.0)
MCV: 91.8 fL (ref 80.0–100.0)
Monocytes Absolute: 0.9 10*3/uL (ref 0.1–1.0)
Monocytes Relative: 12 %
Neutro Abs: 5.4 10*3/uL (ref 1.7–7.7)
Neutrophils Relative %: 67 %
Platelets: 355 10*3/uL (ref 150–400)
RBC: 3.91 MIL/uL (ref 3.87–5.11)
RDW: 12.9 % (ref 11.5–15.5)
WBC: 7.9 10*3/uL (ref 4.0–10.5)
nRBC: 0 % (ref 0.0–0.2)

## 2020-10-13 LAB — COMPREHENSIVE METABOLIC PANEL
ALT: 19 U/L (ref 0–44)
AST: 20 U/L (ref 15–41)
Albumin: 3.4 g/dL — ABNORMAL LOW (ref 3.5–5.0)
Alkaline Phosphatase: 98 U/L (ref 38–126)
Anion gap: 11 (ref 5–15)
BUN: 23 mg/dL (ref 8–23)
CO2: 24 mmol/L (ref 22–32)
Calcium: 9.2 mg/dL (ref 8.9–10.3)
Chloride: 100 mmol/L (ref 98–111)
Creatinine, Ser: 1.13 mg/dL — ABNORMAL HIGH (ref 0.44–1.00)
GFR, Estimated: 52 mL/min — ABNORMAL LOW (ref >60)
Glucose, Bld: 103 mg/dL — ABNORMAL HIGH (ref 70–99)
Potassium: 3.3 mmol/L — ABNORMAL LOW (ref 3.5–5.1)
Sodium: 135 mmol/L (ref 135–145)
Total Bilirubin: 0.2 mg/dL — ABNORMAL LOW (ref 0.3–1.2)
Total Protein: 6.4 g/dL — ABNORMAL LOW (ref 6.5–8.1)

## 2020-10-13 LAB — TROPONIN I (HIGH SENSITIVITY): Troponin I (High Sensitivity): 3 ng/L (ref <18)

## 2020-10-13 MED ORDER — SODIUM CHLORIDE 0.9 % IV BOLUS
1000.0000 mL | Freq: Once | INTRAVENOUS | Status: AC
  Administered 2020-10-13: 1000 mL via INTRAVENOUS

## 2020-10-13 NOTE — ED Provider Notes (Signed)
[facility] DEPT Provider Note   CSN: [id number] Arrival date & time: 10/13/20  1836     History Chief Complaint  Patient presents with  . Loss of Consciousness    Syncopal episode for 30 seconds    Elizabeth Chan is a 75 y.o. female.  Patient states she was in the bathroom helping her daughter when she became dizzy and passed out.  Patient states she does not feel dizzy anymore.  This probably happened to her about 6 months ago.  The history is provided by the patient and a relative. No language interpreter was used.  Loss of Consciousness Episode history:  Single Most recent episode:  Today Timing:  Rare Progression:  Resolved Chronicity:  Recurrent Context: not blood draw   Witnessed: no   Relieved by:  Nothing Worsened by:  Nothing Ineffective treatments:  None tried Associated symptoms: dizziness   Associated symptoms: no anxiety, no chest pain, no headaches and no seizures        Past Medical History:  Diagnosis Date  . Abnormal EKG   . Allergic rhinitis   . Bursitis   . Fibroids   . Generalized headaches    Sinus  . H/O ovarian cystectomy   . Hypertension   . No pertinent past medical history   . Obesity   . Pelvic mass in female     Patient Active Problem List   Diagnosis Date Noted  . Hypertension   . Generalized headaches   . Allergic rhinitis   . Ovarian cyst 06/14/2012  . 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 ABDOMINAL HYSTERECTOMY  1990   for fibroids  . ARTERY BIOPSY Left 04/29/2019   Procedure: LEFT TEMPORAL ARTERY BIOPSY;  Surgeon: Waynetta Sandy, MD;  Location: [facility];  Service: Vascular;  Laterality: Left;  . COLONOSCOPY    . INNER EAR SURGERY  2007  . LAPAROSCOPY N/A 07/08/2012   Procedure: LAPAROSCOPY OPERATIVE;  Surgeon: Avel Sensor, MD;  Location: [facility];  Service:  Gynecology;  Laterality: N/A;  . SALPINGOOPHORECTOMY Bilateral 07/08/2012   Procedure: SALPINGO OOPHORECTOMY;  Surgeon: Avel Sensor, MD;  Location: [facility];  Service: Gynecology;  Laterality: Bilateral;     OB History   No obstetric history on file.     Family History  Problem Relation Age of Onset  . Hypertension Mother   . Diabetes Sister   . Breast cancer Maternal Aunt     Social History   Tobacco Use  . Smoking status: Never Smoker  . Smokeless tobacco: Never Used  Vaping Use  . Vaping Use: Never used  Substance Use Topics  . Alcohol use: No  . Drug use: No    Home Medications Prior to Admission medications   Medication Sig Start Date End Date Taking? Authorizing Provider  amLODipine (NORVASC) 5 MG tablet Take 5 mg by mouth daily.  07/19/18   [provider]  Aromatic Inhalants (VICKS BABYRUB) OINT Apply 1 application topically as needed (applied to scalp as needed for headaches.).    [provider]  BYSTOLIC 20 MG TABS Take 20 mg by mouth daily.  04/03/19   [provider]  levocetirizine (XYZAL) 5 MG tablet Take 5 mg by mouth daily as needed for allergies.    [provider]  Magnesium 250 MG TABS Take 250 mg by mouth daily.  [provider]  montelukast (SINGULAIR) 10 MG tablet Take 10 mg by mouth at bedtime.  04/03/19   [provider]  Multiple Vitamin (MULTIVITAMIN WITH MINERALS) TABS tablet Take 1 tablet by mouth daily. Centrum Adult 50+    [provider]  nystatin-triamcinolone (MYCOLOG II) cream Apply 1 application topically daily. itching/Lichen planus (apply once daily after bathing)    [provider]  predniSONE (DELTASONE) 10 MG tablet Take 10 mg by mouth daily. Take with 20 mg tablet total daily dose=30 mg 04/14/19   [provider]  predniSONE (DELTASONE) 20 MG tablet Take 20 mg by mouth daily. Take with 10 mg tablet total daily dose=30 mg 04/15/19   [provider]  triamcinolone (NASACORT ALLERGY 24HR) 55 MCG/ACT AERO nasal inhaler Place 1 spray into the nose daily.    [provider]  triamterene-hydrochlorothiazide (MAXZIDE-25) 37.5-25 MG per tablet Take 1 tablet by mouth daily.     [provider]  vitamin C (ASCORBIC ACID) 500 MG tablet Take 1,000 mg by mouth daily.    [provider]    Allergies    Doxycycline, Erythromycin, Amoxicillin, and Fluconazole  Review of Systems   Review of Systems  Constitutional: Negative for appetite change and fatigue.  HENT: Negative for congestion, ear discharge and sinus pressure.   Eyes: Negative for discharge.  Respiratory: Negative for cough.   Cardiovascular: Positive for syncope. Negative for chest pain.  Gastrointestinal: Negative for abdominal pain and diarrhea.  Genitourinary: Negative for frequency and hematuria.  Musculoskeletal: Negative for back pain.  Skin: Negative for rash.  Neurological: Positive for dizziness. Negative for seizures and headaches.  Psychiatric/Behavioral: Negative for hallucinations.    Physical Exam Updated Vital Signs BP (!) 147/55   Pulse 72   Temp (!) 97.4 F (36.3 C) (Oral)   Resp (!) 21   Ht 5\' 2"  (1.575 m)   Wt 86.6 kg   SpO2 94%   BMI 34.93 kg/m   Physical Exam Vitals and nursing note reviewed.  Constitutional:      Appearance: She is well-developed.  HENT:     Head: Normocephalic.     Nose: Nose normal.     Mouth/Throat:     Mouth: Mucous membranes are moist.  Eyes:     General: No scleral icterus.    Conjunctiva/sclera: Conjunctivae normal.  Neck:     Thyroid: No thyromegaly.  Cardiovascular:     Rate and Rhythm: Normal rate and regular rhythm.     Heart sounds: No murmur heard. No friction rub. No gallop.   Pulmonary:     Breath sounds: No stridor. No wheezing or rales.  Chest:     Chest wall: No tenderness.  Abdominal:     General: There is no distension.     Tenderness: There is no  abdominal tenderness. There is no rebound.  Musculoskeletal:        General: Normal range of motion.     Cervical back: Neck supple.  Lymphadenopathy:     Cervical: No cervical adenopathy.  Skin:    Findings: No erythema or rash.  Neurological:     Mental Status: She is alert and oriented to person, place, and time.     Motor: No abnormal muscle tone.     Coordination: Coordination normal.  Psychiatric:        Behavior: Behavior normal.     ED Results / Procedures / Treatments   Labs (all labs ordered are listed, but only abnormal  results are displayed) Labs Reviewed  CBC WITH DIFFERENTIAL/PLATELET - Abnormal; Notable for the following components:      Result Value   HCT 35.9 (*)    All other components within normal limits  COMPREHENSIVE METABOLIC PANEL - Abnormal; Notable for the following components:   Potassium 3.3 (*)    Glucose, Bld 103 (*)    Creatinine, Ser 1.13 (*)    Total Protein 6.4 (*)    Albumin 3.4 (*)    Total Bilirubin 0.2 (*)    GFR, Estimated 52 (*)    All other components within normal limits  TROPONIN I (HIGH SENSITIVITY)    EKG EKG Interpretation  Date/Time:  Wednesday Oct 13 2020 20:01:02 EDT Ventricular Rate:  63 PR Interval:  164 QRS Duration: 74 QT Interval:  417 QTC Calculation: 427 R Axis:   45 Text Interpretation: Sinus arrhythmia Minimal ST elevation, inferior leads Confirmed by Milton Ferguson [phone]) on 10/13/2020 11:15:15 PM   Radiology DG Chest 2 View  Result Date: 10/13/2020 CLINICAL DATA:  Syncope. EXAM: CHEST - 2 VIEW COMPARISON:  June 03, 2009. FINDINGS: The heart size and mediastinal contours are within normal limits. Both lungs are clear. The visualized skeletal structures are unremarkable. IMPRESSION: No active cardiopulmonary disease. Electronically Signed   By: Marijo Conception M.D.   On: 10/13/2020 19:51   CT Head Wo Contrast  Result Date: 10/13/2020 CLINICAL DATA:  Cerebral hemorrhage suspected Dizziness, nausea,  syncopal episode. EXAM: CT HEAD WITHOUT CONTRAST TECHNIQUE: Contiguous axial images were obtained from the base of the skull through the vertex without intravenous contrast. COMPARISON:  03/31/2019 FINDINGS: Brain: No intracranial hemorrhage, mass effect, or midline shift. Normal brain volume for age. No hydrocephalus. The basilar cisterns are patent. No evidence of territorial infarct or acute ischemia. No extra-axial or intracranial fluid collection. Vascular: Atherosclerosis of skullbase vasculature without hyperdense vessel or abnormal calcification. Skull: No fracture or focal lesion. Sinuses/Orbits: Paranasal sinuses and mastoid air cells are clear. The visualized orbits are unremarkable. Other: None. IMPRESSION: Unremarkable noncontrast head CT for age. Electronically Signed   By: Keith Rake M.D.   On: 10/13/2020 19:53    Procedures Procedures   Medications Ordered in ED Medications  sodium chloride 0.9 % bolus 1,000 mL (0 mLs Intravenous Stopped 10/13/20 2311)    ED Course  I have reviewed the triage vital signs and the nursing notes.  Pertinent labs & imaging results that were available during my care of the patient were reviewed by me and considered in my medical decision making (see chart for details).    MDM Rules/Calculators/A&P                          Patient with syncopal episode.  Labs unremarkable.  She will follow-up with her PCP.  Doubt cardiac cause Final Clinical Impression(s) / ED Diagnoses Final diagnoses:  Syncope and collapse    Rx / DC Orders ED Discharge Orders    None       Milton Ferguson, MD 10/19/20 1244

## 2020-10-13 NOTE — Discharge Instructions (Addendum)
Make sure you stay hydrated and follow-up with your primary care doctor in the next few days

## 2020-10-13 NOTE — ED Triage Notes (Signed)
Patient BIB [facility] for c/o dizziness, nausea and syncopal episode with loss of consciousness for approx. 30 seconds per family member. When patient was responsive she had an episode of vomiting and diaphoretic.

## 2020-12-29 ENCOUNTER — Other Ambulatory Visit: Payer: Self-pay | Admitting: Internal Medicine

## 2020-12-29 DIAGNOSIS — Z1231 Encounter for screening mammogram for malignant neoplasm of breast: Secondary | ICD-10-CM

## 2021-02-08 ENCOUNTER — Ambulatory Visit
Admission: RE | Admit: 2021-02-08 | Discharge: 2021-02-08 | Disposition: A | Discharge status: Home / Self Care | Payer: BC Managed Care – PPO | Source: Ambulatory Visit | Attending: Internal Medicine | Admitting: Internal Medicine

## 2021-02-08 ENCOUNTER — Other Ambulatory Visit: Payer: Self-pay

## 2021-02-08 DIAGNOSIS — Z1231 Encounter for screening mammogram for malignant neoplasm of breast: Secondary | ICD-10-CM

## 2021-06-30 IMAGING — CT CT HEAD W/O CM
3 of 4 series · 15 of 47 positions shown, 18 images · non-contrast
Comparison: None.

CLINICAL DATA: 72 year old female with left side headaches since
last week. No known injury. Prior middle ear surgery.

EXAM:
CT HEAD WITHOUT CONTRAST
TECHNIQUE: Contiguous axial images were obtained from the base of the skull
through the vertex without intravenous contrast.

[Series 2: head 5.00 hr40 s3 axial ibhc · axial · 0.43mm/px · z∈[-662,-537]mm · 9 of 31 slices shown, 12 images]
[im 3/31  brain]
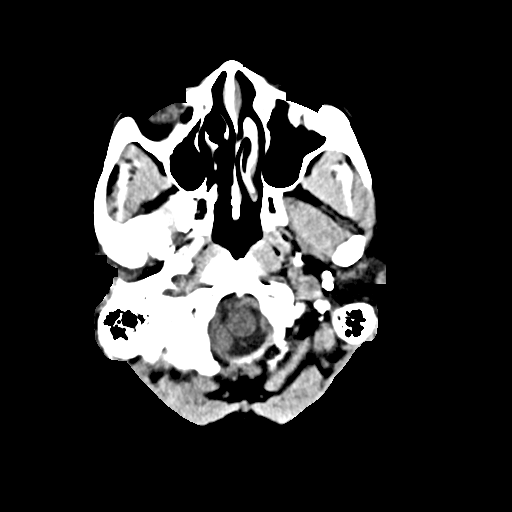
[im 3/31  bone]
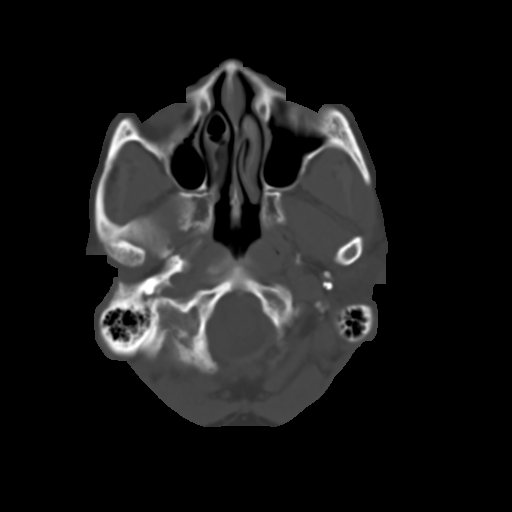
[im 7/31  brain]
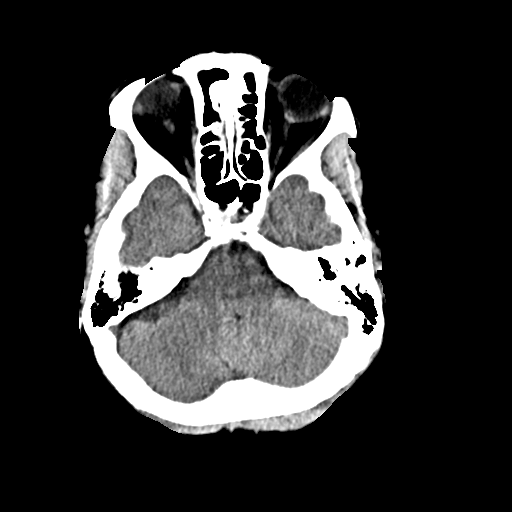
[im 9/31  brain]
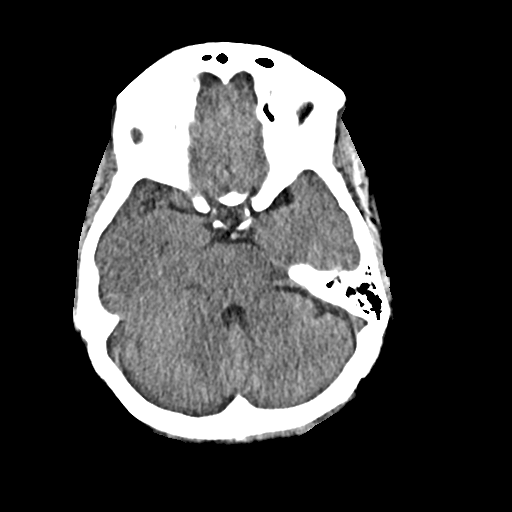
[im 13/31  brain]
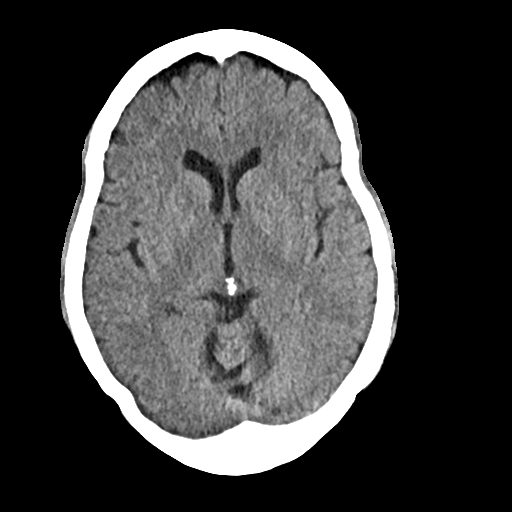
[im 16/31  brain]
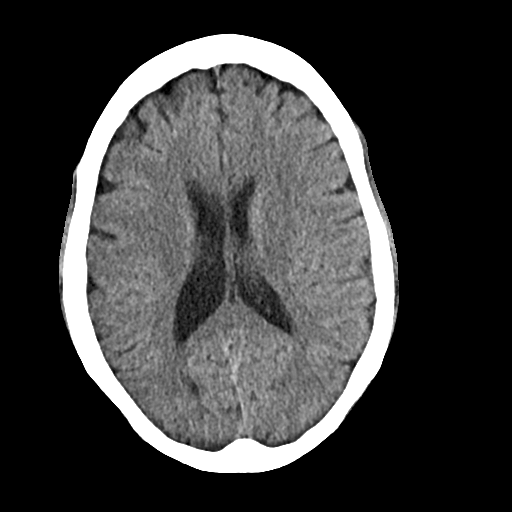
[im 16/31  bone]
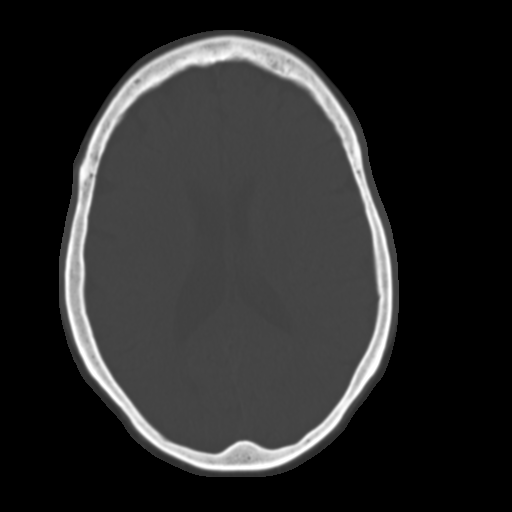
[im 18/31  brain]
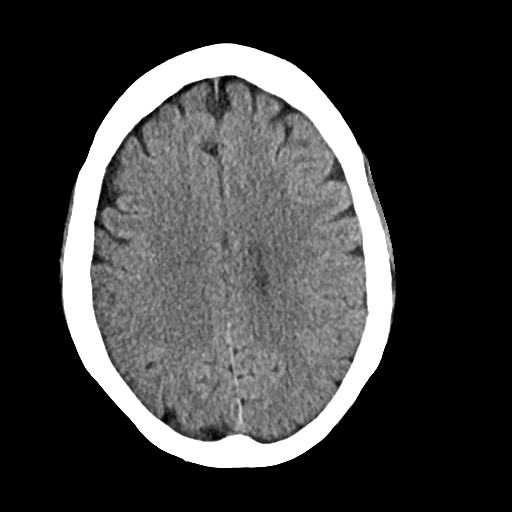
[im 22/31  brain]
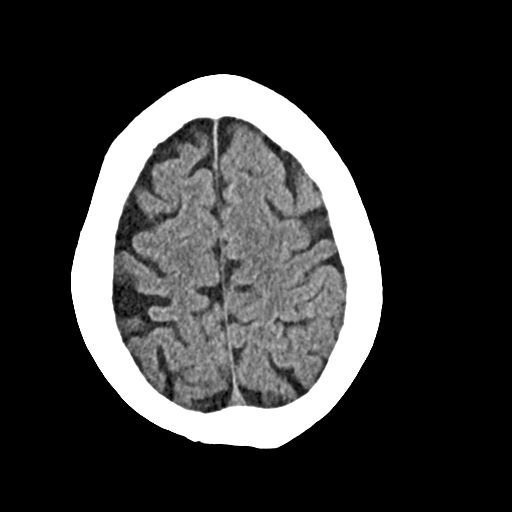
[im 24/31  brain]
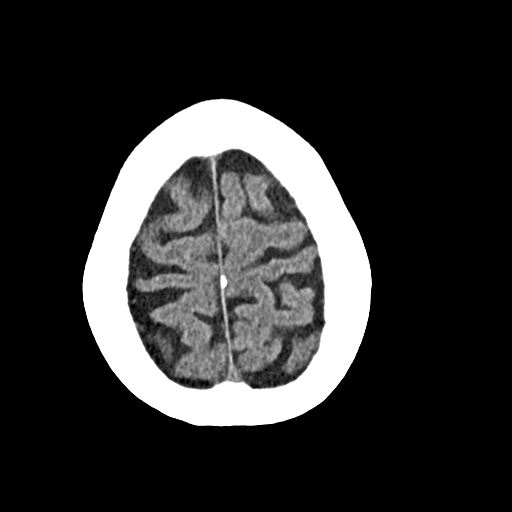
[im 28/31  brain]
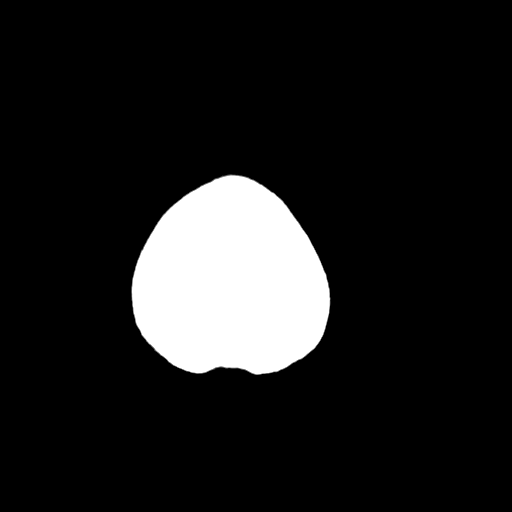
[im 28/31  bone]
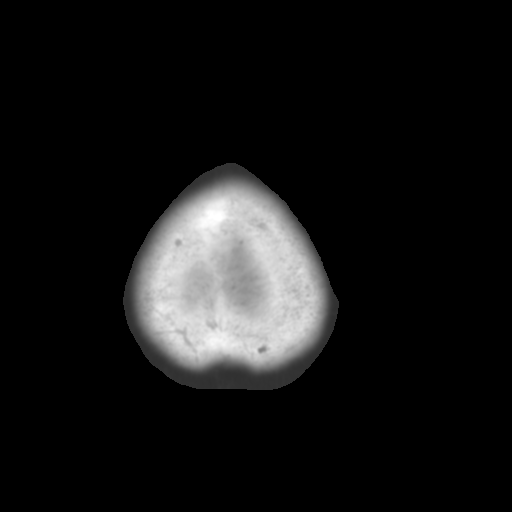

[Series 4: head 3.00 hr40 s3 sag · sagittal · 0.30mm/px · 3 of 55 slices shown]
[im 19/55  brain]
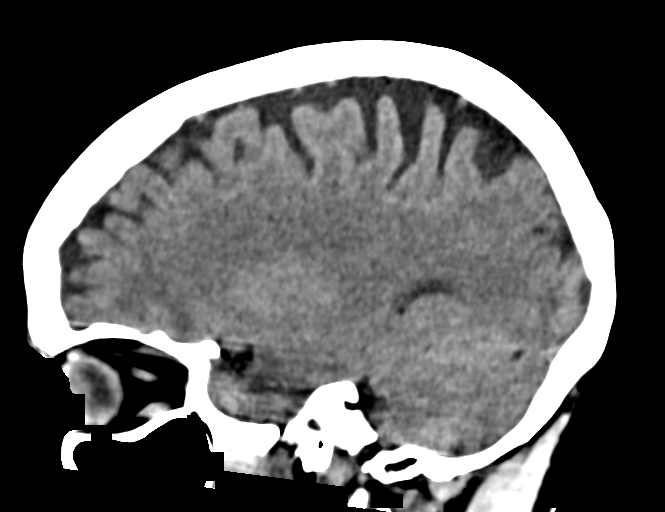
[im 28/55  brain]
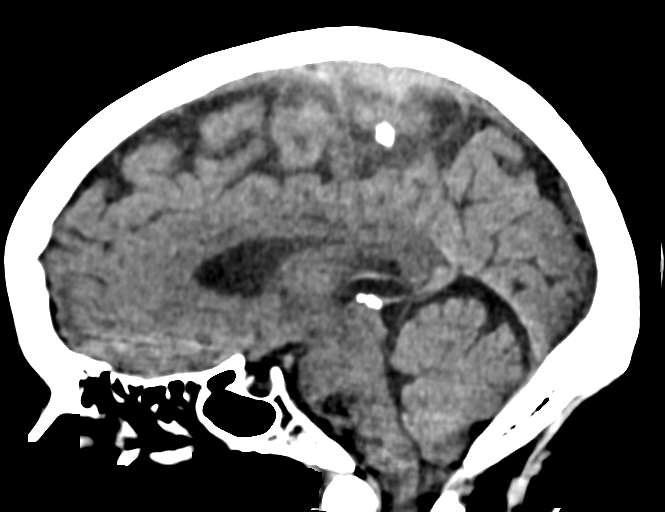
[im 37/55  brain]
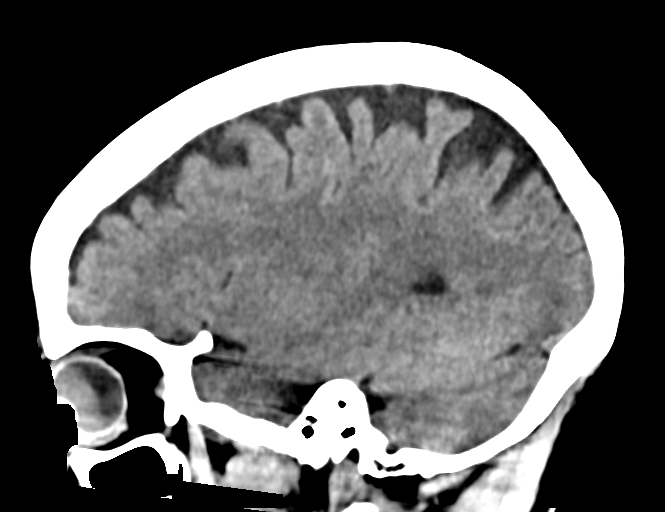

[Series 6: head 3.00 hr40 s3 cor · coronal · 0.30mm/px · 3 of 68 slices shown]
[im 23/68  brain]
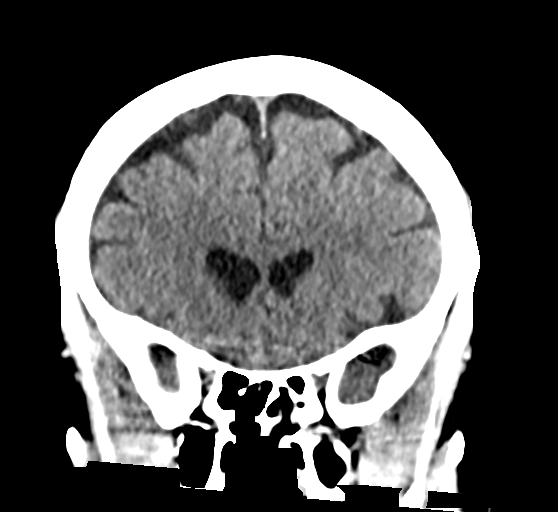
[im 30/68  brain]
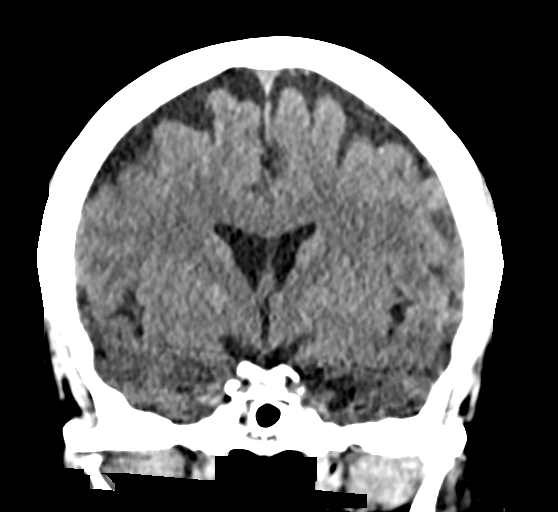
[im 38/68  brain]
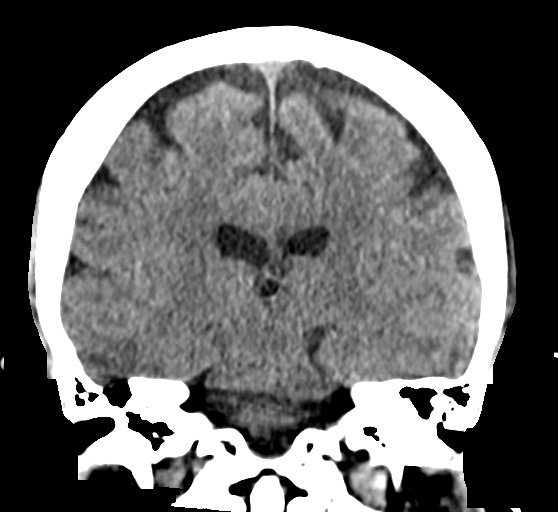

[15 of 47 positions shown; findings below may reference images not displayed]

FINDINGS: Brain: Cerebral volume is within normal limits for age. Streak
artifacts suspected through the lower pons in the posterior fossa.
No midline shift, ventriculomegaly, mass effect, evidence of mass
lesion, intracranial hemorrhage or evidence of cortically based
acute infarction. Gray-white matter differentiation is within normal
limits throughout the brain.

Vascular: Calcified atherosclerosis at the skull base. No suspicious
intracranial vascular hyperdensity.

Skull: Negative.

Sinuses/Orbits: Visualized paranasal sinuses and mastoids are clear.

Other: Visualized orbits and scalp soft tissues are within normal
limits.
IMPRESSION: Normal for age non contrast CT appearance of the brain.

## 2022-01-24 ENCOUNTER — Other Ambulatory Visit: Payer: Self-pay | Admitting: Internal Medicine

## 2022-01-24 DIAGNOSIS — Z1231 Encounter for screening mammogram for malignant neoplasm of breast: Secondary | ICD-10-CM

## 2022-02-09 ENCOUNTER — Ambulatory Visit: Payer: BC Managed Care – PPO

## 2022-03-10 ENCOUNTER — Ambulatory Visit
Admission: RE | Admit: 2022-03-10 | Discharge: 2022-03-10 | Disposition: A | Discharge status: Home / Self Care | Payer: BC Managed Care – PPO | Source: Ambulatory Visit | Attending: Internal Medicine | Admitting: Internal Medicine

## 2022-03-10 DIAGNOSIS — Z1231 Encounter for screening mammogram for malignant neoplasm of breast: Secondary | ICD-10-CM

## 2022-07-03 ENCOUNTER — Other Ambulatory Visit: Payer: Self-pay | Admitting: Internal Medicine

## 2022-07-03 ENCOUNTER — Ambulatory Visit
Admission: RE | Admit: 2022-07-03 | Discharge: 2022-07-03 | Disposition: A | Discharge status: Home / Self Care | Payer: BC Managed Care – PPO | Source: Ambulatory Visit | Attending: Internal Medicine | Admitting: Internal Medicine

## 2022-07-03 DIAGNOSIS — R059 Cough, unspecified: Secondary | ICD-10-CM

## 2022-07-03 DIAGNOSIS — J209 Acute bronchitis, unspecified: Secondary | ICD-10-CM

## 2023-02-02 ENCOUNTER — Other Ambulatory Visit: Payer: Self-pay | Admitting: Internal Medicine

## 2023-02-02 DIAGNOSIS — Z13820 Encounter for screening for osteoporosis: Secondary | ICD-10-CM

## 2023-03-02 ENCOUNTER — Ambulatory Visit
Admission: RE | Admit: 2023-03-02 | Discharge: 2023-03-02 | Disposition: A | Discharge status: Home / Self Care | Payer: BC Managed Care – PPO | Source: Ambulatory Visit | Attending: Internal Medicine | Admitting: Internal Medicine

## 2023-03-02 ENCOUNTER — Other Ambulatory Visit: Payer: Self-pay | Admitting: Internal Medicine

## 2023-03-02 DIAGNOSIS — R053 Chronic cough: Secondary | ICD-10-CM

## 2023-05-21 ENCOUNTER — Other Ambulatory Visit: Payer: Self-pay | Admitting: Internal Medicine

## 2023-05-21 DIAGNOSIS — Z0000 Encounter for general adult medical examination without abnormal findings: Secondary | ICD-10-CM

## 2023-05-24 ENCOUNTER — Ambulatory Visit
Admission: RE | Admit: 2023-05-24 | Discharge: 2023-05-24 | Disposition: A | Discharge status: Home / Self Care | Payer: BC Managed Care – PPO | Source: Ambulatory Visit | Attending: Internal Medicine | Admitting: Internal Medicine

## 2023-05-24 DIAGNOSIS — Z0000 Encounter for general adult medical examination without abnormal findings: Secondary | ICD-10-CM

## 2023-07-10 ENCOUNTER — Encounter: Payer: Self-pay | Admitting: Dermatology

## 2023-07-10 ENCOUNTER — Ambulatory Visit (INDEPENDENT_AMBULATORY_CARE_PROVIDER_SITE_OTHER): Payer: 59 | Admitting: Dermatology

## 2023-07-10 VITALS — BP 129/78 | HR 70

## 2023-07-10 DIAGNOSIS — L309 Dermatitis, unspecified: Secondary | ICD-10-CM | POA: Diagnosis not present

## 2023-07-10 DIAGNOSIS — L439 Lichen planus, unspecified: Secondary | ICD-10-CM | POA: Diagnosis not present

## 2023-07-10 DIAGNOSIS — R21 Rash and other nonspecific skin eruption: Secondary | ICD-10-CM | POA: Diagnosis not present

## 2023-07-10 DIAGNOSIS — H9319 Tinnitus, unspecified ear: Secondary | ICD-10-CM | POA: Insufficient documentation

## 2023-07-10 DIAGNOSIS — J329 Chronic sinusitis, unspecified: Secondary | ICD-10-CM | POA: Insufficient documentation

## 2023-07-10 DIAGNOSIS — H1045 Other chronic allergic conjunctivitis: Secondary | ICD-10-CM | POA: Insufficient documentation

## 2023-07-10 DIAGNOSIS — H6690 Otitis media, unspecified, unspecified ear: Secondary | ICD-10-CM | POA: Insufficient documentation

## 2023-07-10 DIAGNOSIS — J300 Vasomotor rhinitis: Secondary | ICD-10-CM | POA: Insufficient documentation

## 2023-07-10 DIAGNOSIS — R059 Cough, unspecified: Secondary | ICD-10-CM | POA: Insufficient documentation

## 2023-07-10 MED ORDER — PREDNISONE 10 MG PO TABS: ORAL_TABLET | ORAL | 0 refills | Status: AC

## 2023-07-10 MED ORDER — CLOBETASOL PROPIONATE 0.05 % EX CREA: 1.0000 | TOPICAL_CREAM | Freq: Two times a day (BID) | CUTANEOUS | 6 refills | Status: AC

## 2023-07-10 NOTE — Patient Instructions (Addendum)
Hello Ms. Elizabeth Chan,  Thank you for visiting [place] today. We appreciate your commitment to improving your health and addressing your dermatological concerns. Here is a summary of the key instructions and next steps from today's consultation:  Biopsy Procedure: A punch biopsy was performed on one of the lesions on your back to confirm the diagnosis of lichen planus.   Post-Procedure Care: The biopsy area can be washed starting tomorrow. Apply Aquaphor, which we will provide, to the area.  Medications:   Prednisone Taper: Begin with 40 mg for 4 days, then reduce to 30 mg for 4 days, followed by 20 mg for 4 days, and finally 10 mg for 4 days. Take this medication in the morning to minimize potential sleep disturbances.   Topical Treatments: Apply clobetasol cream twice daily for up to two weeks. After this period, we will transition you back to Protopic.  Laboratory Tests: An extensive panel will be conducted to check for inflammatory markers, ANA for lupus, vasculitis markers, and kidney and liver function. Please ensure any previous lab results are forwarded to [place] for comparison if possible.  Follow-Up Appointment: Please return in 2 weeks for suture removal and to review the biopsy results. At this visit, we will also discuss the long-term management plan based on the pathology report.  Skin Care: Utilize CeraVe Anti-Itch with pyridoxine to soothe the skin. Consider oatmeal baths for additional relief.  Please ensure your MyChart is active for seamless communication. If you have any questions or concerns before your next appointment, do not hesitate to contact us through MyChart.  We look forward to supporting you every step of the way and seeing you again.  Warm regards,  Dr. [facility]   Patient Handout: Wound Care for Skin Biopsy Site  Taking Care of Your Skin Biopsy Site  Proper care of the biopsy site is essential for promoting healing and minimizing scarring. This  handout provides instructions on how to care for your biopsy site to ensure optimal recovery.  1. Cleaning the Wound:  Clean the biopsy site daily with gentle soap and water. Gently pat the area dry with a clean, soft towel. Avoid harsh scrubbing or rubbing the area, as this can irritate the skin and delay healing.  2. Applying Aquaphor and Bandage:  After cleaning the wound, apply a thin layer of Aquaphor ointment to the biopsy site. Cover the area with a sterile bandage to protect it from dirt, bacteria, and friction. Change the bandage daily or as needed if it becomes soiled or wet.  3. Continued Care for One Week:  Repeat the cleaning, Aquaphor application, and bandaging process daily for one week following the biopsy procedure. Keeping the wound clean and moist during this initial healing period will help prevent infection and promote optimal healing.  4. Massaging Aquaphor into the Area:  ---After one week, discontinue the use of bandages but continue to apply Aquaphor to the biopsy site. ----Gently massage the Aquaphor into the area using circular motions. ---Massaging the skin helps to promote circulation and prevent the formation of scar tissue.   Additional Tips:  Avoid exposing the biopsy site to direct sunlight during the healing process, as this can cause hyperpigmentation or worsen scarring. If you experience any signs of infection, such as increased redness, swelling, warmth, or drainage from the wound, contact your healthcare provider immediately. Follow any additional instructions provided by your healthcare provider for caring for the biopsy site and managing any discomfort. Conclusion:  Taking proper care of  your skin biopsy site is crucial for ensuring optimal healing and minimizing scarring. By following these instructions for cleaning, applying Aquaphor, and massaging the area, you can promote a smooth and successful recovery. If you have any questions or  concerns about caring for your biopsy site, don't hesitate to contact your healthcare provider for guidance.    Important Information   Due to recent changes in healthcare laws, you may see results of your pathology and/or laboratory studies on MyChart before the doctors have had a chance to review them. We understand that in some cases there may be results that are confusing or concerning to you. Please understand that not all results are received at the same time and often the doctors may need to interpret multiple results in order to provide you with the best plan of care or course of treatment. Therefore, we ask that you please give [place] 2 business days to thoroughly review all your results before contacting the office for clarification. Should we see a critical lab result, you will be contacted sooner.     If You Need Anything After Your Visit   If you have any questions or concerns for your doctor, please call our main line at [phone]. If no one answers, please leave a voicemail as directed and we will return your call as soon as possible. Messages left after 4 pm will be answered the following business day.    You may also send [place] a message via MyChart. We typically respond to MyChart messages within 1-2 business days.  For prescription refills, please ask your pharmacy to contact our office. Our fax number is [phone].  If you have an urgent issue when the clinic is closed that cannot wait until the next business day, you can page your doctor at the number below.     Please note that while we do our best to be available for urgent issues outside of office hours, we are not available 24/7.    If you have an urgent issue and are unable to reach [place], you may choose to seek medical care at your doctor's office, retail clinic, urgent care center, or emergency room.   If you have a medical emergency, please immediately call 911 or go to the emergency department. In the event of  inclement weather, please call our main line at [phone] for an update on the status of any delays or closures.  Dermatology Medication Tips: Please keep the boxes that topical medications come in in order to help keep track of the instructions about where and how to use these. Pharmacies typically print the medication instructions only on the boxes and not directly on the medication tubes.   If your medication is too expensive, please contact our office at [phone] or send [place] a message through MyChart.    We are unable to tell what your co-pay for medications will be in advance as this is different depending on your insurance coverage. However, we may be able to find a substitute medication at lower cost or fill out paperwork to get insurance to cover a needed medication.    If a prior authorization is required to get your medication covered by your insurance company, please allow [place] 1-2 business days to complete this process.   Drug prices often vary depending on where the prescription is filled and some pharmacies may offer cheaper prices.   The website www.goodrx.com contains coupons for medications through different pharmacies. The prices here do not account for  what the cost may be with help from insurance (it may be cheaper with your insurance), but the website can give you the price if you did not use any insurance.  - You can print the associated coupon and take it with your prescription to the pharmacy.  - You may also stop by our office during regular business hours and pick up a GoodRx coupon card.  - If you need your prescription sent electronically to a different pharmacy, notify our office through [facility] or by phone at [phone]

## 2023-07-10 NOTE — Progress Notes (Signed)
 New Patient Visit   Subjective  Elizabeth Chan is a 78 y.o. female who presents for the following: Rash  The patient presents with a pruritic rash that started one month ago. She initially thought it was lichen planus and used Protopic and hydrocortisone 2.5% prescribed by Dr. August Saucer, which provided temporary relief. The patient has been taking hydroxyzine at night to prevent scratching, which causes drowsiness. She also takes hydroxychloroquine three times daily, which also induces sleepiness.  The patient reports that the rash manifests as dark bumps that subsequently itch. The pruritus is rated as 8/10 in severity and primarily affects the back, upper extremities, and thighs. The patient experienced some diarrhea but denies recent travel or consumption of exotic foods. She has a history of giant cell arteritis, which was treated with methotrexate and prednisone for one year, with resolution confirmed by ultrasound of the temporal artery. The patient is no longer on methotrexate or prednisone.  Recent medication changes include switching from Xyzal to Claritin for allergy symptoms. The patient has also discontinued vitamin C and B12 supplements. She reports taking extra-strength Tylenol for hip pain. The patient had a dental procedure in December for a permanent crown, which may be relevant to the current presentation.  Medical History - Lichen planus (past diagnosis, treated with Protopic) - Giant cell arteritis (diagnosed approximately 3 years ago, treated with methotrexate and prednisone for 1 year, resolved) - Lichen planopilaris (LPP) (past diagnosis, treated with Plaquenil in 2015 or 2016)  Current and Past Medications and Supplements - Protopic (used in the past for lichen planus) - Hydrocortisone 2.5% (helped a little temporarily) - Hydroxyzine (taken at night for itching) - Hydroxychloroquine (taken three times a day) - Methotrexate (taken in the past for giant cell arteritis) -  Prednisone (taken in the past for giant cell arteritis) - Amlodipine with olmesartan - Claritin (for allergies) - Singulair - Extra-strength Tylenol (for hip pain) - Multivitamin  Social History - Travel history: No recent travel reported - Diet: No exotic foods consumed recently  Review of Systems - Skin: Itchy bumps, severe itching (8/10 on scale), mostly on back, upper extremities, and thighs - Gastrointestinal: Some diarrhea - Musculoskeletal: Hip pain - General: Sleepiness (from medications)    The following portions of the chart were reviewed this encounter and updated as appropriate: medications, allergies, medical history  Review of Systems:  No other skin or systemic complaints except as noted in HPI or Assessment and Plan.  Objective  Well appearing patient in no apparent distress; mood and affect are within normal limits.  A full examination was performed including scalp, head, eyes, ears, nose, lips, neck, chest, axillae, abdomen, back, buttocks, bilateral upper extremities, hands, feet, fingers and fingernails. All findings within normal limits unless otherwise noted below.   Relevant exam findings are noted in the Assessment and Plan.       Left Upper Back, Mid Back, Right Upper Back Flat top papules lichenified excoriations coalescing into plaques  Assessment & Plan   Suspected Lichen Planus Assessment: Patient presents with a severe flare of suspected lichen planus, characterized by itchy bumps that began a month ago, primarily located on the back, upper extremities, and thighs. The itch severity is rated 8/10. Physical examination revealed Wickham striae in the oral mucosa, left buccal oral mucosa, and lower left jawline. A recent dental procedure (permanent crown placement in December) is considered a potential trigger. The patient has a history of lichen planus treated with Protopic, which was effective, and a  history of giant cell arteritis, treated with  methotrexate and prednisone, which resolved about 2 years ago.  Plan:   Perform a 4mm punch biopsy to confirm diagnosis.   Initiate a prednisone taper: 40mg  for 4 days, 30mg  for 4 days, 20mg  for 4 days, 10mg  for 4 days.   Prescribe clobetasol cream, to be applied twice daily for 2 weeks.   Recommend CeraVe Anti-Itch moisturizer with pyridoxine and suggest Aveeno oatmeal baths for skin calming.   Order comprehensive lab work: CMP, ANA with reflex sedimentation rate, C-reactive protein, NK titers, anti-JO antibodies, rheumatoid factor, thyroid, quantiferon gold, Sjogren's antibodies, and CBC.   Schedule a follow-up in 2 weeks for suture removal and biopsy results review.   Consider long-term management options, potentially including Plaquenil, based on biopsy results.  DERMATITIS   Related Procedures CBC with Differential/Platelets CMP ANA W/Rfx to all if Positive Sedimentation Rate C-reactive Protein ANCA TITERS Jo-1 antibody-IgG Rheumatoid Factor TSH + free T4 QuantiFERON-TB Gold Plus Sjogren's syndrome antibods(ssa + ssb) RASH AND OTHER NONSPECIFIC SKIN ERUPTION Left Upper Back, Mid Back, Right Upper Back Skin / nail biopsy - Right Upper Back Type of biopsy: punch   Informed consent: discussed and consent obtained   Timeout: patient name, date of birth, surgical site, and procedure verified   Procedure prep:  Patient was prepped and draped in usual sterile fashion Prep type:  Isopropyl alcohol Anesthesia: the lesion was anesthetized in a standard fashion   Anesthetic:  1% lidocaine w/ epinephrine 1-100,000 buffered w/ 8.4% NaHCO3 Punch size:  4 mm Suture size:  4-0 Suture type: nylon   Suture removal (days):  14 Hemostasis achieved with: suture and aluminum chloride   Outcome: patient tolerated procedure well   Post-procedure details: sterile dressing applied and wound care instructions given   Dressing type: petrolatum gauze   Specimen 1 - Surgical  pathology Differential Diagnosis: Lichen Planus vs other  Check Margins: No  Return in about 2 weeks (around 07/24/2023) for Lichen Planus & Suture Removal F/U.  Documentation: I have reviewed the above documentation for accuracy and completeness, and I agree with the above.  Stasia Cavalier, am acting as scribe for Langston Reusing, DO.  Langston Reusing, DO

## 2023-07-11 LAB — SURGICAL PATHOLOGY

## 2023-07-12 ENCOUNTER — Ambulatory Visit: Payer: 59 | Admitting: Dermatology

## 2023-07-14 LAB — ANTI-JO 1 ANTIBODY, IGG: Anti JO-1: <0.2 AI (ref 0.0–0.9)

## 2023-07-14 LAB — CBC WITH DIFFERENTIAL/PLATELET
Basophils Absolute: 0 10*3/uL (ref 0.0–0.2)
Basos: 0 %
EOS (ABSOLUTE): 0.2 10*3/uL (ref 0.0–0.4)
Eos: 4 %
Hematocrit: 37.9 % (ref 34.0–46.6)
Hemoglobin: 12.5 g/dL (ref 11.1–15.9)
Immature Grans (Abs): 0 10*3/uL (ref 0.0–0.1)
Immature Granulocytes: 0 %
Lymphocytes Absolute: 1.2 10*3/uL (ref 0.7–3.1)
Lymphs: 25 %
MCH: 30 pg (ref 26.6–33.0)
MCHC: 33 g/dL (ref 31.5–35.7)
MCV: 91 fL (ref 79–97)
Monocytes Absolute: 0.6 10*3/uL (ref 0.1–0.9)
Monocytes: 11 %
Neutrophils Absolute: 2.8 10*3/uL (ref 1.4–7.0)
Neutrophils: 60 %
Platelets: 374 10*3/uL (ref 150–450)
RBC: 4.16 x10E6/uL (ref 3.77–5.28)
RDW: 13.2 % (ref 11.7–15.4)
WBC: 4.8 10*3/uL (ref 3.4–10.8)

## 2023-07-14 LAB — COMPREHENSIVE METABOLIC PANEL
ALT: 42 [IU]/L — ABNORMAL HIGH (ref 0–32)
AST: 36 [IU]/L (ref 0–40)
Albumin: 4.2 g/dL (ref 3.8–4.8)
Alkaline Phosphatase: 239 [IU]/L — ABNORMAL HIGH (ref 44–121)
BUN/Creatinine Ratio: 17 (ref 12–28)
BUN: 20 mg/dL (ref 8–27)
Bilirubin Total: 0.3 mg/dL (ref 0.0–1.2)
CO2: 22 mmol/L (ref 20–29)
Calcium: 10 mg/dL (ref 8.7–10.3)
Chloride: 106 mmol/L (ref 96–106)
Creatinine, Ser: 1.15 mg/dL — ABNORMAL HIGH (ref 0.57–1.00)
Globulin, Total: 2.5 g/dL (ref 1.5–4.5)
Glucose: 84 mg/dL (ref 70–99)
Potassium: 4.4 mmol/L (ref 3.5–5.2)
Sodium: 139 mmol/L (ref 134–144)
Total Protein: 6.7 g/dL (ref 6.0–8.5)
eGFR: 50 mL/min/{1.73_m2} — ABNORMAL LOW (ref >59)

## 2023-07-14 LAB — QUANTIFERON-TB GOLD PLUS
QuantiFERON Mitogen Value: 0.79 [IU]/mL
QuantiFERON Nil Value: 0.01 [IU]/mL
QuantiFERON TB1 Ag Value: 0.01 [IU]/mL
QuantiFERON TB2 Ag Value: 0.02 [IU]/mL
QuantiFERON-TB Gold Plus: NEGATIVE

## 2023-07-14 LAB — TSH+FREE T4
Free T4: 1.05 ng/dL (ref 0.82–1.77)
TSH: 2.11 u[IU]/mL (ref 0.450–4.500)

## 2023-07-14 LAB — SJOGREN'S SYNDROME ANTIBODS(SSA + SSB)
ENA SSA (RO) Ab: <0.2 AI (ref 0.0–0.9)
ENA SSB (LA) Ab: <0.2 AI (ref 0.0–0.9)

## 2023-07-14 LAB — ANCA TITERS
Atypical pANCA: <1:20 {titer}
C-ANCA: <1:20 {titer}
P-ANCA: <1:20 {titer}

## 2023-07-14 LAB — SEDIMENTATION RATE: Sed Rate: 24 mm/h (ref 0–40)

## 2023-07-14 LAB — C-REACTIVE PROTEIN: CRP: 13 mg/L — ABNORMAL HIGH (ref 0–10)

## 2023-07-14 LAB — RHEUMATOID FACTOR: Rheumatoid fact SerPl-aCnc: 13.5 [IU]/mL (ref <14.0)

## 2023-07-14 LAB — ANA W/RFX TO ALL IF POSITIVE: Anti Nuclear Antibody (ANA): NEGATIVE

## 2023-07-16 ENCOUNTER — Telehealth: Payer: Self-pay | Admitting: Dermatology

## 2023-07-16 NOTE — Telephone Encounter (Signed)
 I spoke with pt on 2/24 to discuss lab results

## 2023-07-16 NOTE — Progress Notes (Signed)
 Labs reviewed and show significantly elevated Alk Phos and ALT, and mildly elevated Creatinine.  The elevated liver enzyme could be the cause of the lichen planus flare.  Called pt on 07/16/23 to review results.  Left a voice message and will call her again tomorrow to discuss.  Pt also has a follow up next week to discuss bx results so we may chat about the results then.  - Dr. Onalee Hua

## 2023-07-17 ENCOUNTER — Other Ambulatory Visit: Payer: Self-pay

## 2023-07-17 DIAGNOSIS — Z1159 Encounter for screening for other viral diseases: Secondary | ICD-10-CM

## 2023-07-20 LAB — HEPATITIS B SURFACE ANTIBODY,QUALITATIVE: Hep B Surface Ab, Qual: REACTIVE

## 2023-07-20 LAB — HEPATITIS B SURFACE ANTIGEN: Hepatitis B Surface Ag: NEGATIVE

## 2023-07-20 LAB — AFP TUMOR MARKER: AFP, Serum, Tumor Marker: 10.4 ng/mL — ABNORMAL HIGH (ref 0.0–9.2)

## 2023-07-20 LAB — HEPATITIS B CORE ANTIBODY, TOTAL: Hep B Core Total Ab: POSITIVE — AB

## 2023-07-26 ENCOUNTER — Encounter: Payer: Self-pay | Admitting: Dermatology

## 2023-07-26 ENCOUNTER — Ambulatory Visit: Payer: 59 | Admitting: Dermatology

## 2023-07-26 DIAGNOSIS — L439 Lichen planus, unspecified: Secondary | ICD-10-CM

## 2023-07-26 DIAGNOSIS — B1910 Unspecified viral hepatitis B without hepatic coma: Secondary | ICD-10-CM

## 2023-07-26 DIAGNOSIS — R768 Other specified abnormal immunological findings in serum: Secondary | ICD-10-CM

## 2023-07-26 DIAGNOSIS — R7989 Other specified abnormal findings of blood chemistry: Secondary | ICD-10-CM

## 2023-07-26 MED ORDER — TACROLIMUS 0.1 % EX OINT: TOPICAL_OINTMENT | Freq: Two times a day (BID) | CUTANEOUS | 6 refills | Status: DC

## 2023-07-26 NOTE — Patient Instructions (Signed)
 Hello Elizabeth Chan,  Thank you for visiting [place] today.   Here is a summary of the key instructions and follow-up steps from our discussion today:  Take a break from the Clobetasol Cream, transition to Tacrolimus Ointment.  Apply the tacrolimus twice a day for 2 weeks.  Then transition back to the clobetasol.  Keep alternating each of these every 2 weeks until your next follow up appointment,   Off Weeks Treatment: During the off weeks from the skin cream, use tacrolimus mixed with CeraVe anti-itch lotion to maintain skin health and prevent relapse.  Referral: We will send a referral to Dr. Marge Duncans office for further evaluation of your liver function and for Hepatitis B treatment. .  Medication Refills: I have arranged for 6 refills of tacrolimus to support your ongoing treatment and you have 6 refills of the clobetasol on file.  Next Appointment: Your next appointment with me is scheduled for three months from now. By that time, you should have consulted with the hepatology specialist.  Should you have any questions or require further assistance, please do not hesitate to use MyChart or call our office. We are here to support you every step of the way.  Wishing you continued health and wellness.  Warm regards,  Dr. Langston Reusing, Dermatology   Important Information  Due to recent changes in healthcare laws, you may see results of your pathology and/or laboratory studies on MyChart before the doctors have had a chance to review them. We understand that in some cases there may be results that are confusing or concerning to you. Please understand that not all results are received at the same time and often the doctors may need to interpret multiple results in order to provide you with the best plan of care or course of treatment. Therefore, we ask that you please give [place] 2 business days to thoroughly review all your results before contacting the office for clarification. Should we see a critical  lab result, you will be contacted sooner.   If You Need Anything After Your Visit  If you have any questions or concerns for your doctor, please call our main line at [phone] If no one answers, please leave a voicemail as directed and we will return your call as soon as possible. Messages left after 4 pm will be answered the following business day.   You may also send [place] a message via MyChart. We typically respond to MyChart messages within 1-2 business days.  For prescription refills, please ask your pharmacy to contact our office. Our fax number is [phone].  If you have an urgent issue when the clinic is closed that cannot wait until the next business day, you can page your doctor at the number below.    Please note that while we do our best to be available for urgent issues outside of office hours, we are not available 24/7.   If you have an urgent issue and are unable to reach [place], you may choose to seek medical care at your doctor's office, retail clinic, urgent care center, or emergency room.  If you have a medical emergency, please immediately call 911 or go to the emergency department. In the event of inclement weather, please call our main line at [phone] for an update on the status of any delays or closures.  Dermatology Medication Tips: Please keep the boxes that topical medications come in in order to help keep track of the instructions about where and how to use these. Pharmacies  typically print the medication instructions only on the boxes and not directly on the medication tubes.   If your medication is too expensive, please contact our office at [phone] or send [place] a message through MyChart.   We are unable to tell what your co-pay for medications will be in advance as this is different depending on your insurance coverage. However, we may be able to find a substitute medication at lower cost or fill out paperwork to get insurance to cover a needed  medication.   If a prior authorization is required to get your medication covered by your insurance company, please allow [place] 1-2 business days to complete this process.  Drug prices often vary depending on where the prescription is filled and some pharmacies may offer cheaper prices.  The website www.goodrx.com contains coupons for medications through different pharmacies. The prices here do not account for what the cost may be with help from insurance (it may be cheaper with your insurance), but the website can give you the price if you did not use any insurance.  - You can print the associated coupon and take it with your prescription to the pharmacy.  - You may also stop by our office during regular business hours and pick up a GoodRx coupon card.  - If you need your prescription sent electronically to a different pharmacy, notify our office through [facility] or by phone at [phone]

## 2023-07-26 NOTE — Progress Notes (Signed)
 Follow-Up Visit   Subjective  Elizabeth Chan is a 78 y.o. female who presents for the following: Biopsy follow up -     The following portions of the chart were reviewed this encounter and updated as appropriate: medications, allergies, medical history  Review of Systems:  No other skin or systemic complaints except as noted in HPI or Assessment and Plan.  Objective  Well appearing patient in no apparent distress; mood and affect are within normal limits.   A focused examination was performed of the following areas:   Relevant exam findings are noted in the Assessment and Plan.  Labs Reviewed with Patient  Component     Latest Ref Rng 07/19/2023  Hepatitis B Surface Ag     Negative  Negative   Hep B Surface Ab, Qual Reactive   Hep B Core Total Ab     Negative  Positive !   AFP, Serum, Tumor Marker     0.0 - 9.2 ng/mL 10.4 (H)     Legend: ! Abnormal (H) High  Component     Latest Ref Rng 07/10/2023  WBC     3.4 - 10.8 x10E3/uL 4.8   RBC     3.77 - 5.28 x10E6/uL 4.16   Hemoglobin     11.1 - 15.9 g/dL 16.1   HCT     09.6 - 04.5 % 37.9   MCV     79 - 97 fL 91   MCH     26.6 - 33.0 pg 30.0   MCHC     31.5 - 35.7 g/dL 40.9   RDW     81.1 - 91.4 % 13.2   Platelets     150 - 450 x10E3/uL 374   Neutrophils     Not Estab. % 60   Lymphs     Not Estab. % 25   Monocytes     Not Estab. % 11   Eos     Not Estab. % 4   Basos     Not Estab. % 0   NEUT#     1.4 - 7.0 x10E3/uL 2.8   Lymphs Abs     0.7 - 3.1 x10E3/uL 1.2   Monocytes Absolute     0.1 - 0.9 x10E3/uL 0.6   EOS (ABSOLUTE)     0.0 - 0.4 x10E3/uL 0.2   Basophils Absolute     0.0 - 0.2 x10E3/uL 0.0   Immature Granulocytes     Not Estab. % 0   Immature Grans (Abs)     0.0 - 0.1 x10E3/uL 0.0   Glucose     70 - 99 mg/dL 84   BUN     8 - 27 mg/dL 20   Creatinine     7.82 - 1.00 mg/dL 9.56 (H)   eGFR     >21 mL/min/1.73 50 (L)   BUN/Creatinine Ratio     12 - 28  17   Sodium     134 -  144 mmol/L 139   Potassium     3.5 - 5.2 mmol/L 4.4   Chloride     96 - 106 mmol/L 106   CO2     20 - 29 mmol/L 22   Calcium     8.7 - 10.3 mg/dL 30.8   Total Protein     6.0 - 8.5 g/dL 6.7   Albumin     3.8 - 4.8 g/dL 4.2   Globulin, Total     1.5 -  4.5 g/dL 2.5   Total Bilirubin     0.0 - 1.2 mg/dL 0.3   Alkaline Phosphatase     44 - 121 IU/L 239 (H)   AST     0 - 40 IU/L 36   ALT     0 - 32 IU/L 42 (H)   QUANTIFERON INCUBATION Incubation performed.   QUANTIFERON CRITERIA Comment   QuantiFERON TB1 Ag Value     IU/mL 0.01   QuantiFERON TB2 Ag Value     IU/mL 0.02   Quantiferon Nil Value     IU/mL 0.01   QUANTIFERON MITOGEN VALUE     IU/mL 0.79   QuantiFERON-TB Gold Plus     Negative  Negative   Cytoplasmic (C-ANCA)     Neg:<1:20 titer <1:20   P-ANCA     Neg:<1:20 titer <1:20   Atypical pANCA     Neg:<1:20 titer <1:20   TSH     0.450 - 4.500 uIU/mL 2.110   T4,Free(Direct)     0.82 - 1.77 ng/dL 6.96   ENA SSA (RO) Ab     0.0 - 0.9 AI <0.2   ENA SSB (LA) Ab     0.0 - 0.9 AI <0.2   Anti Nuclear Antibody (ANA)     Negative  Negative   Sed Rate     0 - 40 mm/hr 24   CRP     0 - 10 mg/L 13 (H)   RA Latex Turbid.     <14.0 IU/mL 13.5   SURGICAL PATHOLOGY SURGICAL PATHOLOGY.   Anti JO-1     0.0 - 0.9 AI <0.2     Legend: (H) High (L) Low  Skin Biopsy Results Diagnosis Skin , right upper back LICHEN PLANUS, ATROPHIC   Assessment & Plan   1. Hepatitis B - Assessment: Newly diagnosed hepatitis B, with positive hepatitis B capsule antigen. Per labs, detected in 2021 but not previously communicated to the patient. Alpha-fetoprotein (AFP) is elevated, likely due to chronic hepatitis. Liver function tests show increasing alkaline phosphatase and ALT levels as of 2 weeks ago, indicating ongoing liver inflammation.    - Plan:    Referral to gastroenterology/hepatology (Dr. Bosie Clos at [facility]) for further evaluation and management    Follow-up  liver function tests to be performed by specialist    Pt to discuss potential treatments for hepatitis B with specialist  2. Lichen Planus (Biopsy Proven) - Assessment: Skin biopsy confirmed diagnosis of Lichen Planus. Patient reports improvement in skin condition with reduced itching. The lichen planus lesions are flattening, and the biopsy site is healing well. The condition may be exacerbated by the newly diagnosed hepatitis B.  - Plan:    Continue clobetasol cream for 2 weeks on, 2 weeks off cycle for next 3 months    Prescribe tacrolimus (topical anti-inflammatory) to be used during 2-week off periods    Mix tacrolimus with CeraVe anti-itch lotion for better application    Provide 6 refills for tacrolimus    Follow-up appointment in 3 months    Follow-up in 3 months for reassessment of lichen planus and overall condition.    No follow-ups on file.  I, Elizabeth Chan, CMA, am acting as scribe for [employer], DO .   Documentation: I have reviewed the above documentation for accuracy and completeness, and I agree with the above.  Langston Reusing, DO

## 2023-08-17 ENCOUNTER — Ambulatory Visit
Admission: RE | Admit: 2023-08-17 | Discharge: 2023-08-17 | Disposition: A | Discharge status: Home / Self Care | Payer: BC Managed Care – PPO | Source: Ambulatory Visit | Attending: Internal Medicine | Admitting: Internal Medicine

## 2023-08-17 DIAGNOSIS — Z13820 Encounter for screening for osteoporosis: Secondary | ICD-10-CM

## 2023-08-23 ENCOUNTER — Other Ambulatory Visit: Payer: Self-pay

## 2023-08-23 MED ORDER — SAFETY SEAL MISCELLANEOUS MISC
1.0000 | Freq: Two times a day (BID) | 3 refills | Status: AC
Start: 2023-08-23 — End: ?

## 2023-08-24 ENCOUNTER — Other Ambulatory Visit: Payer: Self-pay | Admitting: Gastroenterology

## 2023-08-24 DIAGNOSIS — R1084 Generalized abdominal pain: Secondary | ICD-10-CM

## 2023-08-24 DIAGNOSIS — R7401 Elevation of levels of liver transaminase levels: Secondary | ICD-10-CM

## 2023-08-25 ENCOUNTER — Encounter: Payer: Self-pay | Admitting: Gastroenterology

## 2023-08-30 ENCOUNTER — Other Ambulatory Visit

## 2023-09-03 ENCOUNTER — Ambulatory Visit
Admission: RE | Admit: 2023-09-03 | Discharge: 2023-09-03 | Disposition: A | Discharge status: Home / Self Care | Source: Ambulatory Visit | Attending: Gastroenterology | Admitting: Gastroenterology

## 2023-09-03 ENCOUNTER — Encounter: Payer: Self-pay | Admitting: Radiology

## 2023-09-03 DIAGNOSIS — R7401 Elevation of levels of liver transaminase levels: Secondary | ICD-10-CM

## 2023-09-03 DIAGNOSIS — R1084 Generalized abdominal pain: Secondary | ICD-10-CM

## 2023-09-03 MED ORDER — IOPAMIDOL (ISOVUE-300) INJECTION 61%
100.0000 mL | Freq: Once | INTRAVENOUS | Status: AC | PRN
  Administered 2023-09-03: 100 mL via INTRAVENOUS

## 2023-09-11 ENCOUNTER — Encounter: Payer: Self-pay | Admitting: Obstetrics & Gynecology

## 2023-09-11 ENCOUNTER — Encounter: Payer: Self-pay | Admitting: Surgical Oncology

## 2023-09-17 ENCOUNTER — Other Ambulatory Visit (HOSPITAL_COMMUNITY): Payer: Self-pay | Admitting: Orthopedic Surgery

## 2023-09-17 ENCOUNTER — Ambulatory Visit (HOSPITAL_COMMUNITY)
Admission: RE | Admit: 2023-09-17 | Discharge: 2023-09-17 | Disposition: A | Discharge status: Home / Self Care | Source: Ambulatory Visit | Attending: Orthopedic Surgery | Admitting: Orthopedic Surgery

## 2023-09-17 DIAGNOSIS — M7989 Other specified soft tissue disorders: Secondary | ICD-10-CM | POA: Insufficient documentation

## 2023-09-17 DIAGNOSIS — M79605 Pain in left leg: Secondary | ICD-10-CM

## 2023-09-17 NOTE — Progress Notes (Signed)
 Lower extremity venous duplex completed. Please see CV Procedures for preliminary results.  Estanislao Heimlich, RVT 09/17/23 1:31 PM

## 2023-10-24 ENCOUNTER — Ambulatory Visit (INDEPENDENT_AMBULATORY_CARE_PROVIDER_SITE_OTHER): Admitting: Dermatology

## 2023-10-24 ENCOUNTER — Encounter: Payer: Self-pay | Admitting: Dermatology

## 2023-10-24 VITALS — BP 151/38 | HR 66

## 2023-10-24 DIAGNOSIS — L439 Lichen planus, unspecified: Secondary | ICD-10-CM

## 2023-10-24 MED ORDER — TRETINOIN 0.025 % EX CREA: TOPICAL_CREAM | Freq: Every day | CUTANEOUS | 4 refills | Status: AC

## 2023-10-24 NOTE — Progress Notes (Unsigned)
   Follow-Up Visit   Subjective  Elizabeth Chan is a 78 y.o. female who presents for the following: follow up on Lichen Planus  Patient stated the tacrolimus  seems to work better than the clobetasol .  She said she feels like there is improvement.  The patient's  lichen planus appears to be primarily affecting her back and abdomen, . She has been using tacrolimus , which has been helpful in reducing itching. The condition has not spread to new areas. Ms. Blackwelder expresses a preference for tacrolimus  over clobetasol  for treatment.  Regarding her previous liver enzyme concerns, Ms. Leason reports following up with a hepatologist and gastroenterologist as recommended. She states that all detailed testing came back negative, and her alkaline phosphatase levels have returned to normal.  The patient mentions taking antihistamines (Zyrtec and Allegra) for seasonal allergies. She inquired about the possibility of pollen as a trigger for her skin condition, mentioning that her daughter had experienced something similar that was thought to be related to pollen allergies.  The following portions of the chart were reviewed this encounter and updated as appropriate: medications, allergies, medical history  Review of Systems:  No other skin or systemic complaints except as noted in HPI or Assessment and Plan.  Objective  Well appearing patient in no apparent distress; mood and affect are within normal limits.   A focused examination was performed of the following areas: back  Relevant exam findings are noted in the Assessment and Plan.    Assessment & Plan   Lichen Planus (Biopsy Proven)  Stable - Assessment:  Patient's skin condition appears unchanged based on photo review.  LP lesions  have not spread to new areas. Pt has consultation with GI doctor to follow up on elevate HepB Core antibody, AFP  and  Alk Phos.and ruled out any active Hep B.  Patient reports tacrolimus  has been helpful in  managing itching. Differential diagnoses may include lichen planus, allergic reaction, or post-viral inflammation. Condition currently considered more cosmetic than medically concerning.  - Plan:    Continue tacrolimus  topically daily    Start tretinoin 0.025% topically every other night, mixed with tacrolimus     Add Eucerin Radiant Tone Dual Active Serum topically twice daily to help treat hyperpigmentation    Patient education:     - Informed about potential side effects of tretinoin (irritation, dryness)     - Instructed to use pea-sized amount of tretinoin     - Advised to stop tretinoin and message via MyChart if irritation occurs    Consider increasing tretinoin strength every 6 months   Return in about 6 months (around 04/24/2024).  IHaig Levan, Surg Tech III, am acting as scribe for [employer], DO.   Documentation: I have reviewed the above documentation for accuracy and completeness, and I agree with the above.  Louana Roup, DO

## 2023-10-24 NOTE — Patient Instructions (Addendum)
 Date: Wed Oct 24 2023  Hello Elizabeth Chan,  Thank you for visiting today. Here is a summary of the key instructions:  - Medications:   - Continue using tacrolimus  daily   - Start tretinoin 0.025% every other night     - Mix with tacrolimus  and lotion     - Use a pea-sized amount   - Use Eucerin Radiant Tone Dual Active Serum twice daily  - Skin Care Routine:   - Every day: Apply tacrolimus  and Eucerin Radiant Tone Dual Active Serum   - Every other night: Add tretinoin together with the tacrolimus  and Eucerin Radiant Tone Serum  - Side Effects:   - If you have skin irritation or dryness, stop using tretinoin and message Dr. Myrtie Atkinson on MyChart  - Follow-up:   - Return for a follow-up appointment in 6 months  - Additional Instructions:   - Bring a copy of your recent lab results to your next appointment   - You can purchase Eucerin Radiant Tone Dual Active Serum at [employer] or on [employer]  Please reach out if you have any questions or concerns.  Warm regards,  Dr. Louana Roup, Dermatology    Important Information  Due to recent changes in healthcare laws, you may see results of your pathology and/or laboratory studies on MyChart before the doctors have had a chance to review them. We understand that in some cases there may be results that are confusing or concerning to you. Please understand that not all results are received at the same time and often the doctors may need to interpret multiple results in order to provide you with the best plan of care or course of treatment. Therefore, we ask that you please give us  2 business days to thoroughly review all your results before contacting the office for clarification. Should we see a critical lab result, you will be contacted sooner.   If You Need Anything After Your Visit  If you have any questions or concerns for your doctor, please call our main line at [phone] If no one answers, please leave a voicemail as directed and we  will return your call as soon as possible. Messages left after 4 pm will be answered the following business day.   You may also send us  a message via MyChart. We typically respond to MyChart messages within 1-2 business days.  For prescription refills, please ask your pharmacy to contact our office. Our fax number is [phone].  If you have an urgent issue when the clinic is closed that cannot wait until the next business day, you can page your doctor at the number below.    Please note that while we do our best to be available for urgent issues outside of office hours, we are not available 24/7.   If you have an urgent issue and are unable to reach us , you may choose to seek medical care at your doctor's office, retail clinic, urgent care center, or emergency room.  If you have a medical emergency, please immediately call 911 or go to the emergency department. In the event of inclement weather, please call our main line at [phone] for an update on the status of any delays or closures.  Dermatology Medication Tips: Please keep the boxes that topical medications come in in order to help keep track of the instructions about where and how to use these. Pharmacies typically print the medication instructions only on the boxes and not directly on the medication tubes.  If your medication is too expensive, please contact our office at [phone] or send us  a message through MyChart.   We are unable to tell what your co-pay for medications will be in advance as this is different depending on your insurance coverage. However, we may be able to find a substitute medication at lower cost or fill out paperwork to get insurance to cover a needed medication.   If a prior authorization is required to get your medication covered by your insurance company, please allow us  1-2 business days to complete this process.  Drug prices often vary depending on where the prescription is filled and some  pharmacies may offer cheaper prices.  The website www.goodrx.com contains coupons for medications through different pharmacies. The prices here do not account for what the cost may be with help from insurance (it may be cheaper with your insurance), but the website can give you the price if you did not use any insurance.  - You can print the associated coupon and take it with your prescription to the pharmacy.  - You may also stop by our office during regular business hours and pick up a GoodRx coupon card.  - If you need your prescription sent electronically to a different pharmacy, notify our office through [facility] or by phone at [phone]    Skin Education :   I counseled the patient regarding the following: Sun screen (SPF 30 or greater) should be applied during peak UV exposure (between 10am and 2pm) and reapplied after exercise or swimming.  The ABCDEs of melanoma were reviewed with the patient, and the importance of monthly self-examination of moles was emphasized. Should any moles change in shape or color, or itch, bleed or burn, pt will contact our office for evaluation sooner then their interval appointment.  Plan: Sunscreen Recommendations I recommended a broad spectrum sunscreen with a SPF of 30 or higher. I explained that SPF 30 sunscreens block approximately 97 percent of the sun's harmful rays. Sunscreens should be applied at least 15 minutes prior to expected sun exposure and then every 2 hours after that as long as sun exposure continues. If swimming or exercising sunscreen should be reapplied every 45 minutes to an hour after getting wet or sweating. One ounce, or the equivalent of a shot glass full of sunscreen, is adequate to protect the skin not covered by a bathing suit. I also recommended a lip balm with a sunscreen as well. Sun protective clothing can be used in lieu of sunscreen but must be worn the entire time you are exposed to the sun's rays.

## 2023-12-06 ENCOUNTER — Ambulatory Visit: Payer: Federal, State, Local not specified - PPO | Admitting: Dermatology

## 2024-04-19 ENCOUNTER — Other Ambulatory Visit: Payer: Self-pay | Admitting: Dermatology

## 2024-04-28 ENCOUNTER — Encounter: Payer: Self-pay | Admitting: Dermatology

## 2024-04-28 ENCOUNTER — Ambulatory Visit: Admitting: Dermatology

## 2024-04-28 VITALS — BP 176/53

## 2024-04-28 DIAGNOSIS — L439 Lichen planus, unspecified: Secondary | ICD-10-CM

## 2024-04-28 DIAGNOSIS — L810 Postinflammatory hyperpigmentation: Secondary | ICD-10-CM

## 2024-04-28 MED ORDER — CLOBETASOL PROPIONATE 0.05 % EX SOLN: 1.0000 | Freq: Two times a day (BID) | CUTANEOUS | 3 refills | Status: AC

## 2024-04-28 MED ORDER — TACROLIMUS 0.1 % EX OINT: TOPICAL_OINTMENT | Freq: Two times a day (BID) | CUTANEOUS | 6 refills | Status: AC

## 2024-04-28 MED ORDER — PREDNISONE 10 MG PO TABS: ORAL_TABLET | ORAL | 0 refills | Status: AC

## 2024-04-28 NOTE — Patient Instructions (Addendum)
 VISIT SUMMARY:  Today, we discussed your ongoing issues with lichen planus and the recent spread of lesions to your scalp, trunk, and arms. We also reviewed your current treatments and made some adjustments to better manage your symptoms. Additionally, we talked about your postinflammatory hyperpigmentation and the steps you can take to improve it.  YOUR PLAN:  -LICHEN PLANUS:  Lichen planus is a condition that causes inflammation in the skin, hair, nails, and mucous membranes. Your lesions have been spreading, possibly due to a recent cold.   Continue alternating clobetasol  and tacrolimus  as you have been.   We have prescribed prednisone  to help control the flares: take 4 tablets daily for 5 days, then 3 tablets daily for 5 days, then 2 tablets daily for 5 days, and finally 1 tablet daily for 5 days.   Use clobetasol  drops on your scalp up to twice daily as needed.   Monitor your blood pressure closely while on prednisone  and reduce your salt intake. If prednisone  is not enough, we may consider hydroxychloroquine in the future.  -POSTINFLAMMATORY HYPERPIGMENTATION:  Postinflammatory hyperpigmentation is darkening of the skin that can occur after an inflammatory skin condition.   Continue using Eucerin Radiant Tone twice daily for cosmetic improvement. We will reassess the use of tretinoin  in three months.  INSTRUCTIONS:  Monitor your blood pressure closely during the prednisone  treatment. Follow the prescribed prednisone  taper schedule and reduce your salt intake. We will reassess your condition and the use of tretinoin  in three months.    Risks of prednisone  taper include mood irritability, insomnia, weight gain, stomach ulcers, increased risk of infection, increased blood sugar (diabetes), hypertension, osteoporosis with long-term or frequent use, and rare risk of avascular necrosis of the hip.      Important Information  Due to recent changes in healthcare laws, you may see  results of your pathology and/or laboratory studies on MyChart before the doctors have had a chance to review them. We understand that in some cases there may be results that are confusing or concerning to you. Please understand that not all results are received at the same time and often the doctors may need to interpret multiple results in order to provide you with the best plan of care or course of treatment. Therefore, we ask that you please give us  2 business days to thoroughly review all your results before contacting the office for clarification. Should we see a critical lab result, you will be contacted sooner.   If You Need Anything After Your Visit  If you have any questions or concerns for your doctor, please call our main line at [phone] If no one answers, please leave a voicemail as directed and we will return your call as soon as possible. Messages left after 4 pm will be answered the following business day.   You may also send us  a message via MyChart. We typically respond to MyChart messages within 1-2 business days.  For prescription refills, please ask your pharmacy to contact our office. Our fax number is [phone].  If you have an urgent issue when the clinic is closed that cannot wait until the next business day, you can page your doctor at the number below.    Please note that while we do our best to be available for urgent issues outside of office hours, we are not available 24/7.   If you have an urgent issue and are unable to reach us , you may choose to seek medical care at your doctor's  office, retail clinic, urgent care center, or emergency room.  If you have a medical emergency, please immediately call 911 or go to the emergency department. In the event of inclement weather, please call our main line at [phone] for an update on the status of any delays or closures.  Dermatology Medication Tips: Please keep the boxes that topical medications come in in  order to help keep track of the instructions about where and how to use these. Pharmacies typically print the medication instructions only on the boxes and not directly on the medication tubes.   If your medication is too expensive, please contact our office at [phone] or send us  a message through MyChart.   We are unable to tell what your co-pay for medications will be in advance as this is different depending on your insurance coverage. However, we may be able to find a substitute medication at lower cost or fill out paperwork to get insurance to cover a needed medication.   If a prior authorization is required to get your medication covered by your insurance company, please allow us  1-2 business days to complete this process.  Drug prices often vary depending on where the prescription is filled and some pharmacies may offer cheaper prices.  The website www.goodrx.com contains coupons for medications through different pharmacies. The prices here do not account for what the cost may be with help from insurance (it may be cheaper with your insurance), but the website can give you the price if you did not use any insurance.  - You can print the associated coupon and take it with your prescription to the pharmacy.  - You may also stop by our office during regular business hours and pick up a GoodRx coupon card.  - If you need your prescription sent electronically to a different pharmacy, notify our office through [facility] or by phone at [phone]

## 2024-04-28 NOTE — Progress Notes (Signed)
   Follow-Up Visit   Subjective  Elizabeth Chan is a 78 y.o. female who presents for the following: 6 month Lichen Planus follow up - mostly of trunk and arms but has spread to her scalp. She states her condition is about the same. She was only using tacrolimus  but she restarted clobetasol  last week and will continue to alternate with tacrolimus  every 2 weeks. She did not get the tretinoin  cream. She was seen by Dr Dianna and was cleared for Hep B. Her last visit was 10/24/2023.  Discussed the use of AI scribe software for clinical note transcription with the patient, who gave verbal consent to proceed.    The following portions of the chart were reviewed this encounter and updated as appropriate: medications, allergies, medical history  Review of Systems:  No other skin or systemic complaints except as noted in HPI or Assessment and Plan.  Objective  Well appearing patient in no apparent distress; mood and affect are within normal limits.   A focused examination was performed of the following areas:    Relevant exam findings are noted in the Assessment and Plan.    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 Recent exacerbation possibly triggered by a cold or flu. Current treatment includes tacrolimus  and clobetasol , with recent addition of clobetasol  drops for scalp involvement. Consideration of oral prednisone  for systemic control due to spreading and active flares. Discussed potential side effects of prednisone , including increased blood pressure and appetite changes. Emphasized monitoring blood pressure and reducing salt intake. Discussed potential long-term treatment with hydroxychloroquine if prednisone  taper is insufficient.   - Continue alternating clobetasol  and tacrolimus . - Prescribed prednisone  10 mg: 4 tablets daily for 5 days, then 3 tablets daily for 5 days, then 2 tablets daily for 5 days,  then 1 tablet daily for 5 days. - Prescribed clobetasol  drops for scalp, use up to twice daily as needed. - Monitor blood pressure closely during prednisone  treatment. - Reduce salt intake. - Will consider hydroxychloroquine if prednisone  taper is insufficient.  Postinflammatory hyperpigmentation Current treatment includes Eucerin Radiant Tone for cosmetic improvement. Tretinoin  was previously prescribed but not yet started due to active flares and potential for skin dryness. - Continue Eucerin Radiant Tone twice daily. - Will reassess tretinoin  use in three months.  Risks of prednisone  taper include mood irritability, insomnia, weight gain, stomach ulcers, increased risk of infection, increased blood sugar (diabetes), hypertension, osteoporosis with long-term or frequent use, and rare risk of avascular necrosis of the hip.      No follow-ups on file.  I, Roseline Hutchinson, CMA, am acting as scribe for [employer], DO .   Documentation: I have reviewed the above documentation for accuracy and completeness, and I agree with the above.  Delon Lenis, DO

## 2024-05-12 ENCOUNTER — Emergency Department (HOSPITAL_BASED_OUTPATIENT_CLINIC_OR_DEPARTMENT_OTHER)

## 2024-05-12 ENCOUNTER — Other Ambulatory Visit: Payer: Self-pay

## 2024-05-12 ENCOUNTER — Emergency Department (HOSPITAL_BASED_OUTPATIENT_CLINIC_OR_DEPARTMENT_OTHER)
Admission: EM | Admit: 2024-05-12 | Discharge: 2024-05-12 | Disposition: A | Discharge status: Home / Self Care | Source: Ambulatory Visit | Attending: Emergency Medicine | Admitting: Emergency Medicine

## 2024-05-12 DIAGNOSIS — R1013 Epigastric pain: Secondary | ICD-10-CM | POA: Diagnosis not present

## 2024-05-12 DIAGNOSIS — D72829 Elevated white blood cell count, unspecified: Secondary | ICD-10-CM | POA: Insufficient documentation

## 2024-05-12 DIAGNOSIS — B029 Zoster without complications: Secondary | ICD-10-CM

## 2024-05-12 DIAGNOSIS — R110 Nausea: Secondary | ICD-10-CM | POA: Diagnosis not present

## 2024-05-12 DIAGNOSIS — R1011 Right upper quadrant pain: Secondary | ICD-10-CM | POA: Diagnosis present

## 2024-05-12 LAB — COMPREHENSIVE METABOLIC PANEL WITH GFR
ALT: 21 U/L (ref 0–44)
AST: 21 U/L (ref 15–41)
Albumin: 3.7 g/dL (ref 3.5–5.0)
Alkaline Phosphatase: 109 U/L (ref 38–126)
Anion gap: 13 (ref 5–15)
BUN: 17 mg/dL (ref 8–23)
CO2: 21 mmol/L — ABNORMAL LOW (ref 22–32)
Calcium: 9.3 mg/dL (ref 8.9–10.3)
Chloride: 104 mmol/L (ref 98–111)
Creatinine, Ser: 1.13 mg/dL — ABNORMAL HIGH (ref 0.44–1.00)
GFR, Estimated: 50 mL/min — ABNORMAL LOW
Glucose, Bld: 111 mg/dL — ABNORMAL HIGH (ref 70–99)
Potassium: 4.1 mmol/L (ref 3.5–5.1)
Sodium: 137 mmol/L (ref 135–145)
Total Bilirubin: 0.3 mg/dL (ref 0.0–1.2)
Total Protein: 6.4 g/dL — ABNORMAL LOW (ref 6.5–8.1)

## 2024-05-12 LAB — URINALYSIS, ROUTINE W REFLEX MICROSCOPIC
Bilirubin Urine: NEGATIVE
Glucose, UA: NEGATIVE mg/dL
Hgb urine dipstick: NEGATIVE
Ketones, ur: NEGATIVE mg/dL
Leukocytes,Ua: NEGATIVE
Nitrite: NEGATIVE
Protein, ur: NEGATIVE mg/dL
Specific Gravity, Urine: 1.005 (ref 1.005–1.030)
pH: 7.5 (ref 5.0–8.0)

## 2024-05-12 LAB — CBC
HCT: 37 % (ref 36.0–46.0)
Hemoglobin: 12.6 g/dL (ref 12.0–15.0)
MCH: 30.5 pg (ref 26.0–34.0)
MCHC: 34.1 g/dL (ref 30.0–36.0)
MCV: 89.6 fL (ref 80.0–100.0)
Platelets: 343 K/uL (ref 150–400)
RBC: 4.13 MIL/uL (ref 3.87–5.11)
RDW: 14.3 % (ref 11.5–15.5)
WBC: 12.8 K/uL — ABNORMAL HIGH (ref 4.0–10.5)
nRBC: 0 % (ref 0.0–0.2)

## 2024-05-12 LAB — LIPASE, BLOOD: Lipase: 18 U/L (ref 11–51)

## 2024-05-12 MED ORDER — ONDANSETRON HCL 4 MG/2ML IJ SOLN
4.0000 mg | Freq: Once | INTRAMUSCULAR | Status: AC | PRN
  Administered 2024-05-12: 4 mg via INTRAVENOUS
  Filled 2024-05-12: qty 2

## 2024-05-12 MED ORDER — ONDANSETRON 4 MG PO TBDP: 4.0000 mg | ORAL_TABLET | ORAL | 0 refills | Status: AC | PRN

## 2024-05-12 MED ORDER — IOHEXOL 300 MG/ML  SOLN
100.0000 mL | Freq: Once | INTRAMUSCULAR | Status: AC | PRN
  Administered 2024-05-12: 100 mL via INTRAVENOUS

## 2024-05-12 MED ORDER — VALACYCLOVIR HCL 1 G PO TABS: 1000.0000 mg | ORAL_TABLET | Freq: Three times a day (TID) | ORAL | 0 refills | Status: AC

## 2024-05-12 MED ORDER — SODIUM CHLORIDE 0.9 % IV BOLUS
1000.0000 mL | Freq: Once | INTRAVENOUS | Status: AC
  Administered 2024-05-12: 1000 mL via INTRAVENOUS

## 2024-05-12 MED ORDER — FENTANYL CITRATE (PF) 50 MCG/ML IJ SOSY
50.0000 ug | PREFILLED_SYRINGE | Freq: Once | INTRAMUSCULAR | Status: AC
  Administered 2024-05-12: 50 ug via INTRAVENOUS
  Filled 2024-05-12: qty 1

## 2024-05-12 NOTE — ED Provider Notes (Signed)
 " [place] EMERGENCY DEPARTMENT AT [facility] Provider Note   CSN: [id number] Arrival date & time: 05/12/24  1201     Patient presents with: Abdominal Pain   Elizabeth Chan is a 78 y.o. female.    Abdominal Pain    78 year old female presenting to the emergency department with a chief complaint of abdominal pain.  The patient states that she has had roughly 1 week of right upper quadrant abdominal pain.  She initially thought it was back pain but it wraps around to her right upper quadrant.  She endorses constant nausea.  She has had no vomiting.  She denies any fevers or chills.  She was initially evaluated this morning in clinic by her orthopedist for back pain but was referred to the emergency department for abdominal pain workup and it was felt that her pain was not likely musculoskeletal.  Prior to Admission medications  Medication Sig Start Date End Date Taking? Authorizing Provider  AMLODIPINE-OLMESARTAN PO Take by mouth.    [provider]  Aromatic Inhalants (VICKS BABYRUB) OINT Apply 1 application topically as needed (applied to scalp as needed for headaches.).    [provider]  clobetasol  (TEMOVATE ) 0.05 % external solution Apply 1 Application topically 2 (two) times daily. Apply to scalp twice daily as needed 04/28/24   Alm Delon SAILOR, DO  clobetasol  cream (TEMOVATE ) 0.05 % Apply 1 Application topically 2 (two) times daily. Apply 2 times a day for 2 weeks then STOP 07/10/23   Alm Delon SAILOR, DO  levocetirizine (XYZAL) 5 MG tablet Take 5 mg by mouth daily as needed for allergies.    [provider]  montelukast (SINGULAIR) 10 MG tablet Take 10 mg by mouth at bedtime.  04/03/19   [provider]  Multiple Vitamin (MULTIVITAMIN WITH MINERALS) TABS tablet Take 1 tablet by mouth daily. Centrum Adult 50+    [provider]  nystatin-triamcinolone (MYCOLOG II) cream Apply 1 application topically daily. itching/Lichen  planus (apply once daily after bathing) Patient not taking: Reported on 07/10/2023    [provider]  predniSONE  (DELTASONE ) 10 MG tablet Take 4 tablets (40 mg total) by mouth daily with breakfast for 5 days, THEN 3 tablets (30 mg total) daily with breakfast for 5 days, THEN 2 tablets (20 mg total) daily with breakfast for 5 days, THEN 1 tablet (10 mg total) daily with breakfast for 5 days. 04/28/24 05/18/24  Alm Delon SAILOR, DO  Safety Seal Miscellaneous MISC Apply 1 application  topically in the morning and at bedtime. Tacrolimus  0.12% cream - apply to affected areas twice daily x 2 weeks. Alternate every 2 weeks with Clobetasol . 08/23/23   Alm Delon SAILOR, DO  tacrolimus  (PROTOPIC ) 0.1 % ointment Apply topically 2 (two) times daily. Apply to affected areas twice daily x 2 weeks. Alternate with clobetasol  every 2 weeks. 04/28/24   Alm Delon SAILOR, DO  tretinoin  (RETIN-A ) 0.025 % cream Apply topically at bedtime. 10/24/23 10/23/24  Alm Delon SAILOR, DO  triamcinolone (NASACORT ALLERGY 24HR) 55 MCG/ACT AERO nasal inhaler Place 1 spray into the nose daily.    [provider]  vitamin C (ASCORBIC ACID) 500 MG tablet Take 1,000 mg by mouth daily.    [provider]    Allergies: Doxycycline, Erythromycin, Amoxicillin, and Fluconazole    Review of Systems  Gastrointestinal:  Positive for abdominal pain.  All other systems reviewed and are negative.   Updated Vital Signs BP (!) 169/80 (BP Location: Right Arm)  Pulse 76   Temp 97.8 F (36.6 C)   Resp 16   SpO2 95%   Physical Exam Vitals and nursing note reviewed.  Constitutional:      General: She is not in acute distress.    Appearance: She is well-developed.  HENT:     Head: Normocephalic and atraumatic.  Eyes:     Conjunctiva/sclera: Conjunctivae normal.  Cardiovascular:     Rate and Rhythm: Normal rate and regular rhythm.     Heart sounds: No murmur heard. Pulmonary:     Effort: Pulmonary effort is  normal. No respiratory distress.     Breath sounds: Normal breath sounds.  Abdominal:     Palpations: Abdomen is soft.     Tenderness: There is abdominal tenderness in the right upper quadrant. There is no guarding. Positive signs include Murphy's sign.  Musculoskeletal:        General: No swelling.     Cervical back: Neck supple.  Skin:    General: Skin is warm and dry.     Capillary Refill: Capillary refill takes less than 2 seconds.  Neurological:     Mental Status: She is alert.  Psychiatric:        Mood and Affect: Mood normal.     (all labs ordered are listed, but only abnormal results are displayed) Labs Reviewed  LIPASE, BLOOD  COMPREHENSIVE METABOLIC PANEL WITH GFR  CBC  URINALYSIS, ROUTINE W REFLEX MICROSCOPIC    EKG: None  Radiology: No results found.   Procedures   Medications Ordered in the ED  ondansetron  (ZOFRAN ) injection 4 mg (has no administration in time range)                                    Medical Decision Making Amount and/or Complexity of Data Reviewed Labs: ordered. Radiology: ordered.  Risk Prescription drug management.     78 year old female presenting to the emergency department with a chief complaint of abdominal pain.  The patient states that she has had roughly 1 week of right upper quadrant abdominal pain.  She initially thought it was back pain but it wraps around to her right upper quadrant.  She endorses constant nausea.  She has had no vomiting.  She denies any fevers or chills.  She was initially evaluated this morning in clinic by her orthopedist for back pain but was referred to the emergency department for abdominal pain workup and it was felt that her pain was not likely musculoskeletal.  On arrival, the patient was vitally stable, afebrile, not tachycardic or tachypneic, BP 169/80, saturating 95% on room air.  On exam the patient had right upper quadrant tenderness to palpation with positive Murphy sign.  Given the  patient's epigastric tenderness as well, differential is broad, primary concern for cholelithiasis/cholecystitis, is considered pancreatitis, small bowel obstruction, diverticulitis or other acute intra-abdominal emergency.  IV access was obtained the patient was administered IV fentanyl  for pain control as well as IV Zofran  for nausea.  Laboratory evaluation as well as CT imaging ordered.  Labs significant for leukocytosis to 12.6 which is nonspecific, urinalysis pending, CMP unremarkable and lipase normal.  CT abdomen pelvis was pending at time of signout, signout given to Dr. Armenta to follow-up the results of CT imaging and reassess the patient.     Final diagnoses:  None    ED Discharge Orders     None  Jerrol Agent, MD 05/12/24 1755  "

## 2024-05-12 NOTE — Discharge Instructions (Addendum)
 1.  Your CT scan did not show an immediate abnormality.  It suggest constipation but your history does not suggest constipation.  Your history and exam are still concerning for possible gallbladder disease.  We were unable to get an ultrasound done today in the emergency department.  I want you to have an ultrasound done tomorrow to make sure that your gallbladder looks all right. 2.  Sometimes people can have inflammation of the gallbladder that does not show up on CT scan.  Return immediately if you develop a fever, suddenly worsening of pain, feeling generally ill or other concerning changes. 3.  You may take the hydrocodone that was prescribed earlier for pain if needed.  Also take Colace twice daily this is a stool softener to try to avoid constipation.  Take as little hydrocodone as possible as this make promote constipation. 4.  See your family doctor again for recheck within the next several days.  Return to emergency department immediately for any worsening or changing symptoms.  If your ultrasound shows evidence of gallbladder inflammation, you will need to return to the emergency department for further treatment. 5.  Your rash is very suspicious for shingles.  You are being started on Valtrex .  Continue to monitor the rash and see your doctor for recheck.

## 2024-05-12 NOTE — ED Provider Notes (Addendum)
 Epigastric and RUQ pain, getting CT. Physical Exam  BP (!) 169/80 (BP Location: Right Arm)   Pulse 76   Temp 97.8 F (36.6 C)   Resp 16   SpO2 95%   Physical Exam     Procedures  Procedures  ED Course / MDM    Medical Decision Making Amount and/or Complexity of Data Reviewed Labs: ordered. Radiology: ordered.  Risk Prescription drug management.   Patient reports that she has been dealing with pain on her right side over the past week.  Initially it was more central and thoracic posterior and she thought it was musculoskeletal.  However, since that time this is migrated around to the right upper abdomen and epigastrium.  She has suffered nausea with this.  Patient reports she has had some loss of appetite as well.  She was referred to the emergency department for further evaluation for possible gallbladder disease.  CT abdomen pelvis which I personally reviewed as well does not show evidence stone or wall thickening.  Radiology makes note of possible constipation but no other acute abnormalities.  On examination patient has mild discomfort to palpation in the right upper quadrant and epigastrium.  Plan was to get an ultrasound for possible cholecystitis not manifesting on CT.  Due to scheduling, there is no ultrasound available.  I discussed options with the patient.  She has been stable for a week with this.  She has mild leukocytosis but no fever, no guarding, no vomiting.  She feels comfortable coming back tomorrow morning for right upper quadrant ultrasound to see if there is occult cholecystitis.  We have reviewed a plan for pain control at home.  We discussed the possibility of constipation although by history this sounds somewhat less probable.  We have reviewed return precautions and follow-up.  After completing plan and preparing for discharge, patient did point out to the nursing staff that it had been thought she might have shingles.  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  I have reexamined it and I do feel that she has a rash that is highly suspicious for shingles.  See attached images.  It appears that the patient's pain preceded onset of rash and the rash may only have been present for several days now.  I am opting to start her on Valtrex .       Armenta Canning, MD 05/12/24 [id number]    Armenta Canning, MD 05/12/24 [id number]

## 2024-05-12 NOTE — ED Triage Notes (Signed)
 Pt reports Right mid-back pain wrapping around to her ULQ for past, along with constant nausea.  Pt denies d/v or fever.  Pt seen by Dr Addie this am and advised to report to ED for evaluation.  AAOx4, Pt visibly uncomfortable and restless in triage.

## 2024-05-13 ENCOUNTER — Ambulatory Visit (HOSPITAL_BASED_OUTPATIENT_CLINIC_OR_DEPARTMENT_OTHER)
Admission: RE | Admit: 2024-05-13 | Discharge: 2024-05-13 | Disposition: A | Discharge status: Home / Self Care | Source: Ambulatory Visit | Attending: Emergency Medicine | Admitting: Emergency Medicine

## 2024-05-13 DIAGNOSIS — R1011 Right upper quadrant pain: Secondary | ICD-10-CM | POA: Insufficient documentation
